# Patient Record
Sex: Female | Born: 1975
Health system: Southern US, Community
[De-identification: ages and names within clinical notes are randomized; demographics above are authoritative.]

## PROBLEM LIST (undated history)

## (undated) DIAGNOSIS — J302 Other seasonal allergic rhinitis: Secondary | ICD-10-CM

## (undated) DIAGNOSIS — R87619 Unspecified abnormal cytological findings in specimens from cervix uteri: Secondary | ICD-10-CM

## (undated) DIAGNOSIS — B019 Varicella without complication: Secondary | ICD-10-CM

## (undated) DIAGNOSIS — IMO0002 Reserved for concepts with insufficient information to code with codable children: Secondary | ICD-10-CM

## (undated) HISTORY — PX: ADENOIDECTOMY: SUR15

## (undated) HISTORY — PX: TONSILLECTOMY: SUR1361

## (undated) HISTORY — PX: APPENDECTOMY: SHX54

## (undated) HISTORY — DX: Varicella without complication: B01.9

## (undated) HISTORY — PX: WISDOM TOOTH EXTRACTION: SHX21

## (undated) HISTORY — DX: Reserved for concepts with insufficient information to code with codable children: IMO0002

## (undated) HISTORY — PX: LEEP: SHX91

## (undated) HISTORY — DX: Other seasonal allergic rhinitis: J30.2

## (undated) HISTORY — DX: Unspecified abnormal cytological findings in specimens from cervix uteri: R87.619

---

## 1998-01-14 ENCOUNTER — Ambulatory Visit (HOSPITAL_BASED_OUTPATIENT_CLINIC_OR_DEPARTMENT_OTHER): Admission: RE | Admit: 1998-01-14 | Discharge: 1998-01-14 | Payer: Self-pay | Admitting: Otolaryngology

## 1998-07-24 ENCOUNTER — Other Ambulatory Visit: Admission: RE | Admit: 1998-07-24 | Discharge: 1998-07-24 | Payer: Self-pay | Admitting: Obstetrics & Gynecology

## 1998-12-30 ENCOUNTER — Other Ambulatory Visit: Admission: RE | Admit: 1998-12-30 | Discharge: 1998-12-30 | Payer: Self-pay | Admitting: Obstetrics and Gynecology

## 1999-01-28 ENCOUNTER — Other Ambulatory Visit: Admission: RE | Admit: 1999-01-28 | Discharge: 1999-01-28 | Payer: Self-pay | Admitting: Obstetrics & Gynecology

## 1999-01-29 ENCOUNTER — Other Ambulatory Visit: Admission: RE | Admit: 1999-01-29 | Discharge: 1999-01-29 | Payer: Self-pay | Admitting: Obstetrics & Gynecology

## 1999-01-29 ENCOUNTER — Encounter (INDEPENDENT_AMBULATORY_CARE_PROVIDER_SITE_OTHER): Payer: Self-pay

## 1999-02-21 ENCOUNTER — Encounter (INDEPENDENT_AMBULATORY_CARE_PROVIDER_SITE_OTHER): Payer: Self-pay | Admitting: Specialist

## 1999-02-21 ENCOUNTER — Ambulatory Visit (HOSPITAL_COMMUNITY): Admission: RE | Admit: 1999-02-21 | Discharge: 1999-02-21 | Payer: Self-pay | Admitting: Obstetrics & Gynecology

## 1999-07-21 ENCOUNTER — Other Ambulatory Visit: Admission: RE | Admit: 1999-07-21 | Discharge: 1999-07-21 | Payer: Self-pay | Admitting: *Deleted

## 1999-11-26 ENCOUNTER — Other Ambulatory Visit: Admission: RE | Admit: 1999-11-26 | Discharge: 1999-11-26 | Payer: Self-pay | Admitting: Obstetrics and Gynecology

## 2000-05-31 ENCOUNTER — Other Ambulatory Visit: Admission: RE | Admit: 2000-05-31 | Discharge: 2000-05-31 | Payer: Self-pay | Admitting: Obstetrics and Gynecology

## 2000-11-26 ENCOUNTER — Other Ambulatory Visit: Admission: RE | Admit: 2000-11-26 | Discharge: 2000-11-26 | Payer: Self-pay | Admitting: Obstetrics and Gynecology

## 2001-06-22 ENCOUNTER — Other Ambulatory Visit: Admission: RE | Admit: 2001-06-22 | Discharge: 2001-06-22 | Payer: Self-pay | Admitting: Obstetrics and Gynecology

## 2001-08-17 ENCOUNTER — Other Ambulatory Visit: Admission: RE | Admit: 2001-08-17 | Discharge: 2001-08-17 | Payer: Self-pay | Admitting: Obstetrics and Gynecology

## 2001-09-06 ENCOUNTER — Emergency Department (HOSPITAL_COMMUNITY): Admission: EM | Admit: 2001-09-06 | Discharge: 2001-09-06 | Payer: Self-pay

## 2001-09-06 ENCOUNTER — Encounter: Payer: Self-pay | Admitting: Emergency Medicine

## 2001-11-23 ENCOUNTER — Other Ambulatory Visit: Admission: RE | Admit: 2001-11-23 | Discharge: 2001-11-23 | Payer: Self-pay | Admitting: Obstetrics and Gynecology

## 2003-04-04 ENCOUNTER — Other Ambulatory Visit: Admission: RE | Admit: 2003-04-04 | Discharge: 2003-04-04 | Payer: Self-pay | Admitting: Obstetrics and Gynecology

## 2003-11-14 ENCOUNTER — Other Ambulatory Visit: Admission: RE | Admit: 2003-11-14 | Discharge: 2003-11-14 | Payer: Self-pay | Admitting: Obstetrics and Gynecology

## 2005-01-07 ENCOUNTER — Other Ambulatory Visit: Admission: RE | Admit: 2005-01-07 | Discharge: 2005-01-07 | Payer: Self-pay | Admitting: Obstetrics and Gynecology

## 2005-12-06 ENCOUNTER — Emergency Department (HOSPITAL_COMMUNITY): Admission: EM | Admit: 2005-12-06 | Discharge: 2005-12-06 | Payer: Self-pay | Admitting: Family Medicine

## 2006-03-23 ENCOUNTER — Other Ambulatory Visit: Admission: RE | Admit: 2006-03-23 | Discharge: 2006-03-23 | Payer: Self-pay | Admitting: Obstetrics and Gynecology

## 2006-04-02 ENCOUNTER — Encounter: Admission: RE | Admit: 2006-04-02 | Discharge: 2006-04-02 | Payer: Self-pay | Admitting: Obstetrics and Gynecology

## 2010-04-18 ENCOUNTER — Observation Stay (HOSPITAL_COMMUNITY)
Admission: EM | Admit: 2010-04-18 | Discharge: 2010-04-19 | Payer: Self-pay | Source: Home / Self Care | Admitting: Emergency Medicine

## 2010-07-29 LAB — URINALYSIS, ROUTINE W REFLEX MICROSCOPIC
Leukocytes, UA: NEGATIVE
Nitrite: NEGATIVE
Protein, ur: NEGATIVE mg/dL
Specific Gravity, Urine: 1.021 (ref 1.005–1.030)
Urobilinogen, UA: 0.2 mg/dL (ref 0.0–1.0)

## 2010-07-29 LAB — DIFFERENTIAL
Basophils Absolute: 0 10*3/uL (ref 0.0–0.1)
Basophils Relative: 0 % (ref 0–1)
Eosinophils Absolute: 0.2 10*3/uL (ref 0.0–0.7)
Monocytes Absolute: 1 10*3/uL (ref 0.1–1.0)
Monocytes Relative: 7 % (ref 3–12)
Neutrophils Relative %: 69 % (ref 43–77)

## 2010-07-29 LAB — BASIC METABOLIC PANEL
BUN: 5 mg/dL — ABNORMAL LOW (ref 6–23)
CO2: 26 mEq/L (ref 19–32)
Calcium: 9.3 mg/dL (ref 8.4–10.5)
Glucose, Bld: 103 mg/dL — ABNORMAL HIGH (ref 70–99)
Sodium: 139 mEq/L (ref 135–145)

## 2010-07-29 LAB — URINE MICROSCOPIC-ADD ON

## 2010-07-29 LAB — CBC
Hemoglobin: 14.4 g/dL (ref 12.0–15.0)
MCH: 31.6 pg (ref 26.0–34.0)
MCHC: 33.3 g/dL (ref 30.0–36.0)
RDW: 12.6 % (ref 11.5–15.5)

## 2010-07-29 LAB — HEPATIC FUNCTION PANEL
Alkaline Phosphatase: 73 U/L (ref 39–117)
Total Bilirubin: 0.4 mg/dL (ref 0.3–1.2)
Total Protein: 6.7 g/dL (ref 6.0–8.3)

## 2010-07-29 LAB — LIPASE, BLOOD: Lipase: 24 U/L (ref 11–59)

## 2011-07-29 ENCOUNTER — Ambulatory Visit: Payer: Self-pay | Admitting: Obstetrics and Gynecology

## 2011-07-29 ENCOUNTER — Ambulatory Visit (INDEPENDENT_AMBULATORY_CARE_PROVIDER_SITE_OTHER): Payer: BC Managed Care – PPO | Admitting: Obstetrics and Gynecology

## 2011-07-29 DIAGNOSIS — Z8349 Family history of other endocrine, nutritional and metabolic diseases: Secondary | ICD-10-CM

## 2011-07-29 DIAGNOSIS — Z01419 Encounter for gynecological examination (general) (routine) without abnormal findings: Secondary | ICD-10-CM

## 2012-02-02 ENCOUNTER — Encounter: Payer: BC Managed Care – PPO | Admitting: Obstetrics and Gynecology

## 2012-02-16 ENCOUNTER — Encounter: Payer: Self-pay | Admitting: Obstetrics and Gynecology

## 2012-02-16 ENCOUNTER — Ambulatory Visit (INDEPENDENT_AMBULATORY_CARE_PROVIDER_SITE_OTHER): Payer: BC Managed Care – PPO | Admitting: Obstetrics and Gynecology

## 2012-02-16 VITALS — BP 116/76 | Resp 16 | Ht 65.0 in | Wt 209.0 lb

## 2012-02-16 DIAGNOSIS — IMO0001 Reserved for inherently not codable concepts without codable children: Secondary | ICD-10-CM | POA: Insufficient documentation

## 2012-02-16 DIAGNOSIS — R87610 Atypical squamous cells of undetermined significance on cytologic smear of cervix (ASC-US): Secondary | ICD-10-CM

## 2012-02-16 DIAGNOSIS — O3440 Maternal care for other abnormalities of cervix, unspecified trimester: Secondary | ICD-10-CM

## 2012-02-16 DIAGNOSIS — Z9889 Other specified postprocedural states: Secondary | ICD-10-CM | POA: Insufficient documentation

## 2012-02-16 NOTE — Progress Notes (Signed)
Last Pap Normal: no Date: 07/29/11 Grade: ASCUS High Risk HPV: no Vaginal Discharge:no Prior LEEP:yes 02/21/99 Prior Conization:no Prior Cryotherapy:no Prior Lazer:no

## 2012-02-16 NOTE — Progress Notes (Signed)
REPEAT PAP VISIT  Subjective:    Wendy Tate is a 36 y.o. female who presents for a  repeat pap.  Had long cycle in August (13 days), but normal cycles before and after.  No contraception--female partner.  History of previous cervical treatment:  LEEP 2000  Last pap showed:  ASCUS with NEGATIVE high risk HPV  Date 07/2011  High Risk HPV present: no   Tobacco use:  Yes   Objective:   Pelvic: WNL  External genitalia: normal Perianal skin: no external genital warts noted Vagina: normal without discharge Cervix: normal in appearance  Assessment and Plan:   ASCUS with neg HPV 3/13 Hx LEEP 2000 Pap repeated today. F/u at annual if no advancement in pap. F/U with me if cycles demonstrate recurrent changes.  Landi Biscardi, CNM 10/1/201310:21 AM

## 2012-02-19 LAB — HUMAN PAPILLOMAVIRUS, HIGH RISK: HPV DNA High Risk: NOT DETECTED

## 2012-02-22 ENCOUNTER — Encounter: Payer: Self-pay | Admitting: Obstetrics and Gynecology

## 2012-12-01 ENCOUNTER — Other Ambulatory Visit: Payer: Self-pay | Admitting: Obstetrics and Gynecology

## 2012-12-27 ENCOUNTER — Other Ambulatory Visit: Payer: Self-pay | Admitting: Obstetrics and Gynecology

## 2013-01-03 ENCOUNTER — Encounter (HOSPITAL_COMMUNITY): Payer: Self-pay | Admitting: Obstetrics and Gynecology

## 2013-01-12 ENCOUNTER — Encounter (HOSPITAL_COMMUNITY): Payer: Self-pay | Admitting: Pharmacy Technician

## 2013-01-17 NOTE — H&P (Signed)
Wendy Tate is an 37 y.o. female. Presenting for D&C Hysteroscopy polypectomy.  Pt states she has spotting 4-5 days before each menses.  She had SHG sig for endometrial polyp.  EMBX negative  Pertinent Gynecological History: Menses: flow is moderate Bleeding: intermenstrual bleeding Contraception: none DES exposure: denies Blood transfusions: none Sexually transmitted diseases: no past history Previous GYN Procedures: LEEP  Last mammogram: na Date: na Last pap: normal Date: 2014 OB History: G0, P0   Menstrual History: Menarche age: 48  Patient's last menstrual period was 12/11/2012.    Past Medical History  Diagnosis Date  . Abnormal Pap smear 04/2007, 04/2009, 07/2011    ASCUS     Past Surgical History  Procedure Laterality Date  . Tonsillectomy    . Appendectomy    . Leep    . Adenoidectomy      No family history on file.  Social History:  reports that she quit smoking about 17 months ago. She has never used smokeless tobacco. She reports that she drinks about 0.5 ounces of alcohol per week. She reports that she uses illicit drugs.  Allergies: No Known Allergies  No prescriptions prior to admission    ROS  Height 5\' 5"  (1.651 m), weight 206 lb (93.441 kg), last menstrual period 12/11/2012. Physical Exam Physical Examination: General appearance - alert, well appearing, and in no distress Mental status - alert, oriented to person, place, and time Chest - clear to auscultation, no wheezes, rales or rhonchi, symmetric air entry Heart - normal rate and regular rhythm Abdomen - soft, nontender, nondistended, no masses or organomegaly Pelvic - normal external genitalia, vulva, vagina, cervix, uterus and adnexa Extremities - peripheral pulses normal, no pedal edema, no clubbing or cyanosis   No results found for this or any previous visit (from the past 24 hour(s)).  No results found.  Assessment/Plan Metrorrhagia Endometrial polyp All treatments reviewed with  the pt .  These are but not limited to: obs with repeat SHG, hormones, D&C hysteroscopy with and without ablation and hysterectomy.   Pt chose D&C hysteroscopy with polypectomy.  She understands the risks are but not limited to bleeding, infection, perforation of the uterus which can cause damage to internal organs.    Milbern Doescher A 01/17/2013, 10:04 PM

## 2013-01-18 ENCOUNTER — Encounter (HOSPITAL_COMMUNITY)
Admission: RE | Disposition: A | Discharge status: Home / Self Care | Payer: Self-pay | Source: Ambulatory Visit | Attending: Obstetrics and Gynecology

## 2013-01-18 ENCOUNTER — Ambulatory Visit (HOSPITAL_COMMUNITY): Payer: BC Managed Care – PPO | Admitting: Anesthesiology

## 2013-01-18 ENCOUNTER — Encounter (HOSPITAL_COMMUNITY): Payer: Self-pay | Admitting: Certified Registered"

## 2013-01-18 ENCOUNTER — Ambulatory Visit (HOSPITAL_COMMUNITY)
Admission: RE | Admit: 2013-01-18 | Discharge: 2013-01-18 | Disposition: A | Discharge status: Home / Self Care | Payer: BC Managed Care – PPO | Source: Ambulatory Visit | Attending: Obstetrics and Gynecology | Admitting: Obstetrics and Gynecology

## 2013-01-18 ENCOUNTER — Encounter (HOSPITAL_COMMUNITY): Payer: Self-pay | Admitting: Anesthesiology

## 2013-01-18 DIAGNOSIS — N921 Excessive and frequent menstruation with irregular cycle: Secondary | ICD-10-CM | POA: Insufficient documentation

## 2013-01-18 DIAGNOSIS — N840 Polyp of corpus uteri: Secondary | ICD-10-CM | POA: Insufficient documentation

## 2013-01-18 HISTORY — PX: DILATATION & CURETTAGE/HYSTEROSCOPY WITH TRUECLEAR: SHX6353

## 2013-01-18 LAB — CBC
HCT: 40.4 % (ref 36.0–46.0)
Hemoglobin: 13.9 g/dL (ref 12.0–15.0)
WBC: 8.3 10*3/uL (ref 4.0–10.5)

## 2013-01-18 SURGERY — DILATATION & CURETTAGE/HYSTEROSCOPY WITH TRUCLEAR
Anesthesia: General | Site: Vagina | Wound class: Clean Contaminated

## 2013-01-18 MED ORDER — SODIUM CHLORIDE 0.9 % IR SOLN
Status: DC | PRN
  Administered 2013-01-18 (×2): 3000 mL

## 2013-01-18 MED ORDER — FENTANYL CITRATE 0.05 MG/ML IJ SOLN
INTRAMUSCULAR | Status: AC
  Filled 2013-01-18: qty 2

## 2013-01-18 MED ORDER — ONDANSETRON HCL 4 MG/2ML IJ SOLN
INTRAMUSCULAR | Status: AC
  Filled 2013-01-18: qty 2

## 2013-01-18 MED ORDER — SILVER NITRATE-POT NITRATE 75-25 % EX MISC
CUTANEOUS | Status: AC
  Filled 2013-01-18: qty 2

## 2013-01-18 MED ORDER — MIDAZOLAM HCL 2 MG/2ML IJ SOLN
INTRAMUSCULAR | Status: AC
  Filled 2013-01-18: qty 2

## 2013-01-18 MED ORDER — MIDAZOLAM HCL 2 MG/2ML IJ SOLN
INTRAMUSCULAR | Status: DC | PRN
  Administered 2013-01-18: 2 mg via INTRAVENOUS

## 2013-01-18 MED ORDER — LIDOCAINE HCL (CARDIAC) 20 MG/ML IV SOLN
INTRAVENOUS | Status: DC | PRN
  Administered 2013-01-18: 80 mg via INTRAVENOUS

## 2013-01-18 MED ORDER — KETOROLAC TROMETHAMINE 30 MG/ML IJ SOLN
INTRAMUSCULAR | Status: DC | PRN
  Administered 2013-01-18: 30 mg via INTRAVENOUS

## 2013-01-18 MED ORDER — LACTATED RINGERS IV SOLN
INTRAVENOUS | Status: DC
  Administered 2013-01-18 (×2): via INTRAVENOUS

## 2013-01-18 MED ORDER — DEXAMETHASONE SODIUM PHOSPHATE 10 MG/ML IJ SOLN
INTRAMUSCULAR | Status: DC | PRN
  Administered 2013-01-18: 10 mg via INTRAVENOUS

## 2013-01-18 MED ORDER — LIDOCAINE HCL 2 % IJ SOLN
INTRAMUSCULAR | Status: DC | PRN
  Administered 2013-01-18: 20 mL

## 2013-01-18 MED ORDER — HYDROCODONE-ACETAMINOPHEN 5-325 MG PO TABS: 1.0000 | ORAL_TABLET | Freq: Four times a day (QID) | ORAL | Status: DC | PRN

## 2013-01-18 MED ORDER — FENTANYL CITRATE 0.05 MG/ML IJ SOLN: 25.0000 ug | INTRAMUSCULAR | Status: DC | PRN

## 2013-01-18 MED ORDER — PROPOFOL 10 MG/ML IV BOLUS
INTRAVENOUS | Status: DC | PRN
  Administered 2013-01-18: 160 mg via INTRAVENOUS

## 2013-01-18 MED ORDER — LIDOCAINE HCL 2 % IJ SOLN
INTRAMUSCULAR | Status: AC
  Filled 2013-01-18: qty 20

## 2013-01-18 MED ORDER — ONDANSETRON HCL 4 MG/2ML IJ SOLN
INTRAMUSCULAR | Status: DC | PRN
  Administered 2013-01-18: 4 mg via INTRAVENOUS

## 2013-01-18 MED ORDER — FENTANYL CITRATE 0.05 MG/ML IJ SOLN
INTRAMUSCULAR | Status: DC | PRN
  Administered 2013-01-18 (×2): 50 ug via INTRAVENOUS

## 2013-01-18 SURGICAL SUPPLY — 23 items
BLADE INCISOR TRUC PLUS 2.9 (ABLATOR) IMPLANT
CANISTERS HI-FLOW 3000CC (CANNISTER) ×2 IMPLANT
CATH ROBINSON RED A/P 16FR (CATHETERS) ×2 IMPLANT
CLOTH BEACON ORANGE TIMEOUT ST (SAFETY) ×2 IMPLANT
CONTAINER PREFILL 10% NBF 60ML (FORM) ×4 IMPLANT
DRAPE HYSTEROSCOPY (DRAPE) ×2 IMPLANT
DRESSING TELFA 8X3 (GAUZE/BANDAGES/DRESSINGS) ×2 IMPLANT
ELECT REM PT RETURN 9FT ADLT (ELECTROSURGICAL) ×2
ELECTRODE REM PT RTRN 9FT ADLT (ELECTROSURGICAL) ×1 IMPLANT
GLOVE BIO SURGEON STRL SZ 6.5 (GLOVE) ×2 IMPLANT
GLOVE BIOGEL PI IND STRL 7.0 (GLOVE) ×1 IMPLANT
GLOVE BIOGEL PI INDICATOR 7.0 (GLOVE) ×1
GOWN STRL REIN XL XLG (GOWN DISPOSABLE) ×4 IMPLANT
INCISOR TRUC PLUS BLADE 2.9 (ABLATOR)
KIT HYSTEROSCOPY TRUCLEAR (ABLATOR) ×1 IMPLANT
MORCELLATOR RECIP TRUCLEAR 4.0 (ABLATOR) IMPLANT
NDL SPNL 22GX3.5 QUINCKE BK (NEEDLE) ×1 IMPLANT
NEEDLE SPNL 22GX3.5 QUINCKE BK (NEEDLE) ×2 IMPLANT
PACK VAGINAL MINOR WOMEN LF (CUSTOM PROCEDURE TRAY) ×2 IMPLANT
PAD OB MATERNITY 4.3X12.25 (PERSONAL CARE ITEMS) ×2 IMPLANT
SYR 20CC LL (SYRINGE) ×2 IMPLANT
TOWEL OR 17X24 6PK STRL BLUE (TOWEL DISPOSABLE) ×4 IMPLANT
WATER STERILE IRR 1000ML POUR (IV SOLUTION) ×2 IMPLANT

## 2013-01-18 NOTE — Op Note (Signed)
Pre op DX: Endometrial Polyp, CPT 58558 1 hour   Post Op [id number] with metrorrhagia   PHYSICIAN : Elowen Debruyn   ASSISTANTS: none   ANESTHESIA:   General LMA and paracervical block  ESTIMATED BLOOD LOSS: minimal  LOCAL MEDICATIONS USED:  LIDOCAINE 20CC  SPECIMEN:  Source of Specimen:  endometrial curettings and polyp  DISPOSITION OF SPECIMEN:  PATHOLOGY  COUNTS Correct:  YES    DICTATION #: The patient was taken to the operating room and prepped and draped in a normal sterile fashion. An in out catheter was used to drain the bladder.   A bivalve speculum was placed into the vagina and anterior lip of the cervix was grasped with a single-tooth tenaculum.  20 cc of 2% lidocaine was used for cervical block.  the cervix was then dilated with Shawnie Pons dilators up to 19. The hysteroscope was placed into the uterine cavity. The  entire uterus and both ostia were visualized.  A fundal polyp was removed using trueclear. Some fluffy endometrium was also removed using trueclear. The endometrial curettings and polyp were sent to pathology. The hysteroscope was removed.   The tenaculum was removed from the cervix and hemostasis was noted.  Silver nitrate was applied to the tenaculum site PLAN OF CARE: discharge to home  PATIENT DISPOSITION:  PACU - hemodynamically stable.

## 2013-01-18 NOTE — Interval H&P Note (Signed)
History and Physical Interval Note:  01/18/2013 10:13 AM  Wendy Tate  has presented today for surgery, with the diagnosis of Endometrial Polyp,   The various methods of treatment have been discussed with the patient and family. After consideration of risks, benefits and other options for treatment, the patient has consented to  Procedure(s) with comments: DILATATION & CURETTAGE/HYSTEROSCOPY WITH TRUECLEAR (N/A) - D&C Hysteroscopy with TruClear as a surgical intervention .  The patient's history has been reviewed, patient examined, no change in status, stable for surgery.  I have reviewed the patient's chart and labs.  Questions were answered to the patient's satisfaction.     [facility] A

## 2013-01-18 NOTE — Transfer of Care (Signed)
Immediate Anesthesia Transfer of Care Note  Patient: Wendy Tate  Procedure(s) Performed: Procedure(s) with comments: DILATATION & CURETTAGE/HYSTEROSCOPY WITH TRUECLEAR (N/A) - D&C Hysteroscopy with TruClear  Patient Location: PACU  Anesthesia Type:General  Level of Consciousness: awake, alert  and oriented  Airway & Oxygen Therapy: Patient Spontanous Breathing and Patient connected to nasal cannula oxygen  Post-op Assessment: Report given to PACU RN, Post -op Vital signs reviewed and stable and Patient moving all extremities  Post vital signs: Reviewed and stable  Complications: No apparent anesthesia complications

## 2013-01-18 NOTE — Anesthesia Postprocedure Evaluation (Signed)
  Anesthesia Post-op Note  Patient: Wendy Tate  Procedure(s) Performed: Procedure(s) with comments: DILATATION & CURETTAGE/HYSTEROSCOPY WITH TRUECLEAR (N/A) - D&C Hysteroscopy with TruClear Patient is awake and responsive. Pain and nausea are reasonably well controlled. Vital signs are stable and clinically acceptable. Oxygen saturation is clinically acceptable. There are no apparent anesthetic complications at this time. Patient is ready for discharge.

## 2013-01-18 NOTE — Preoperative (Signed)
Beta Blockers   Reason not to administer Beta Blockers:Not Applicable 

## 2013-01-18 NOTE — Anesthesia Preprocedure Evaluation (Signed)
Anesthesia Evaluation  Patient identified by MRN, date of birth, ID band Patient awake    Reviewed: Allergy & Precautions, H&P , Patient's Chart, lab work & pertinent test results, reviewed documented beta blocker date and time   Airway Mallampati: II TM Distance: >3 FB Neck ROM: full    Dental no notable dental hx.    Pulmonary  breath sounds clear to auscultation  Pulmonary exam normal       Cardiovascular Rhythm:regular Rate:Normal     Neuro/Psych    GI/Hepatic   Endo/Other    Renal/GU      Musculoskeletal   Abdominal   Peds  Hematology   Anesthesia Other Findings   Reproductive/Obstetrics                           Anesthesia Physical Anesthesia Plan  ASA: II  Anesthesia Plan:    Post-op Pain Management:    Induction: Intravenous  Airway Management Planned: LMA  Additional Equipment:   Intra-op Plan:   Post-operative Plan:   Informed Consent: I have reviewed the patients History and Physical, chart, labs and discussed the procedure including the risks, benefits and alternatives for the proposed anesthesia with the patient or authorized representative who has indicated his/her understanding and acceptance.   Dental Advisory Given and Dental advisory given  Plan Discussed with: CRNA and Surgeon  Anesthesia Plan Comments:         Anesthesia Quick Evaluation  

## 2013-01-19 ENCOUNTER — Encounter (HOSPITAL_COMMUNITY): Payer: Self-pay | Admitting: Obstetrics and Gynecology

## 2014-07-03 ENCOUNTER — Other Ambulatory Visit: Payer: Self-pay | Admitting: Obstetrics and Gynecology

## 2014-07-03 DIAGNOSIS — Z1231 Encounter for screening mammogram for malignant neoplasm of breast: Secondary | ICD-10-CM

## 2014-08-23 ENCOUNTER — Other Ambulatory Visit: Payer: Self-pay | Admitting: Obstetrics and Gynecology

## 2014-08-23 ENCOUNTER — Ambulatory Visit
Admission: RE | Admit: 2014-08-23 | Discharge: 2014-08-23 | Disposition: A | Discharge status: Home / Self Care | Payer: No Typology Code available for payment source | Source: Ambulatory Visit | Attending: Obstetrics and Gynecology | Admitting: Obstetrics and Gynecology

## 2014-08-23 DIAGNOSIS — Z1231 Encounter for screening mammogram for malignant neoplasm of breast: Secondary | ICD-10-CM

## 2016-02-29 LAB — BASIC METABOLIC PANEL
BUN: 7 mg/dL (ref 4–21)
Creatinine: 0.7 mg/dL (ref 0.5–1.1)
GLUCOSE: 81 mg/dL
Potassium: 4.4 mmol/L (ref 3.4–5.3)
SODIUM: 140 mmol/L (ref 137–147)

## 2016-02-29 LAB — HEPATIC FUNCTION PANEL
ALT: 15 U/L (ref 7–35)
AST: 17 U/L (ref 13–35)
Alkaline Phosphatase: 69 U/L (ref 25–125)
Bilirubin, Total: 0.3 mg/dL

## 2016-02-29 LAB — CBC AND DIFFERENTIAL
HEMATOCRIT: 43 % (ref 36–46)
Hemoglobin: 14.4 g/dL (ref 12.0–16.0)
Platelets: 276 10*3/uL (ref 150–399)
WBC: 7.8 10^3/mL

## 2016-03-18 ENCOUNTER — Encounter: Payer: Self-pay | Admitting: Family Medicine

## 2016-03-18 ENCOUNTER — Ambulatory Visit (INDEPENDENT_AMBULATORY_CARE_PROVIDER_SITE_OTHER): Payer: Managed Care, Other (non HMO) | Admitting: Family Medicine

## 2016-03-18 ENCOUNTER — Other Ambulatory Visit: Payer: Self-pay | Admitting: Emergency Medicine

## 2016-03-18 VITALS — BP 112/72 | HR 81 | Temp 99.0°F | Ht 64.5 in | Wt 207.4 lb

## 2016-03-18 DIAGNOSIS — Z23 Encounter for immunization: Secondary | ICD-10-CM

## 2016-03-18 DIAGNOSIS — E669 Obesity, unspecified: Secondary | ICD-10-CM | POA: Diagnosis not present

## 2016-03-18 DIAGNOSIS — Z1329 Encounter for screening for other suspected endocrine disorder: Secondary | ICD-10-CM

## 2016-03-18 DIAGNOSIS — Z13220 Encounter for screening for lipoid disorders: Secondary | ICD-10-CM

## 2016-03-18 LAB — LIPID PANEL
CHOLESTEROL: 219 mg/dL — AB (ref 0–200)
HDL: 41.1 mg/dL (ref >39.00)
LDL Cholesterol: 153 mg/dL — ABNORMAL HIGH (ref 0–99)
NonHDL: 178.08
Total CHOL/HDL Ratio: 5
Triglycerides: 125 mg/dL (ref 0.0–149.0)
VLDL: 25 mg/dL (ref 0.0–40.0)

## 2016-03-18 LAB — TSH: TSH: 1.92 u[IU]/mL (ref 0.35–4.50)

## 2016-03-18 NOTE — Progress Notes (Signed)
[place] at [facility] [address] [place], [place] [address] [phone]  Date:  03/18/2016   Name:  Wendy Tate   DOB:  11/30/1975   MRN:  [id number]  PCP:  Lamar Blinks, MD    Chief Complaint: No chief complaint on file.   History of Present Illness:  Wendy Tate is a 40 y.o. very pleasant female patient who presents with the following:  Here today to establish as a new patient.  Her usual health care provider is Donnel Saxon who is a Proofreader at [employer].   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   She is married, no children.  She enjoys doing crafts in her free time, and she also enjoys being out in nature.   Admits that she does not get a lot of exercise but she has started an at home walking DVD this week.  She has also considered joining a gym She is fasting today for labs and recently had a CMP/ CBC done per [facility]- this is a weight loss program that is popular in our area.  She does need a TSH and lipids still  She does not know the date of her last tetanus; would like to do today She has done a mammogram in May- it was normal  She may spoke an occasional clove cigar.  Maybe 2 a month Patient Active Problem List   Diagnosis Date Noted  . ASCUS (atypical squamous cells of undetermined significance) on Pap smear 02/16/2012  . Hx LEEP (loop electrosurgical excision procedure 02/16/2012    Past Medical History:  Diagnosis Date  . Abnormal Pap smear 04/2007, 04/2009, 07/2011   ASCUS   . Chicken pox   . Seasonal allergies     Past Surgical History:  Procedure Laterality Date  . ADENOIDECTOMY    . APPENDECTOMY    . DILATATION & CURETTAGE/HYSTEROSCOPY WITH TRUECLEAR N/A 01/18/2013   Procedure: DILATATION & CURETTAGE/HYSTEROSCOPY WITH  TRUECLEAR;  Surgeon: Betsy Coder, MD;  Location: [facility];  Service: Gynecology;  Laterality: N/A;  D&C Hysteroscopy with TruClear  . LEEP    . TONSILLECTOMY    . WISDOM TOOTH EXTRACTION      Social History  Substance Use Topics  . Smoking status: Light Tobacco Smoker    Last attempt to quit: 08/04/2011  . Smokeless tobacco: Never Used     Comment: Socially  . Alcohol use 0.5 oz/week    1 Standard drinks or equivalent per week     Comment: socially    Family History  Problem Relation Age of Onset  . Heart disease Mother   . Diabetes Mother   . Heart disease Father   . Leukemia Father     died at age 72  . Arthritis Maternal Grandmother   . Stroke Maternal Grandmother   . Diabetes Maternal Grandmother     No Known Allergies  Medication list has been reviewed and updated.  Current Outpatient Prescriptions on File Prior to Visit  Medication Sig Dispense Refill  . HYDROcodone-acetaminophen (NORCO/VICODIN) 5-325 MG per tablet Take 1 tablet by mouth every 6 (six) hours as needed for pain. 30 tablet 0  . ibuprofen (ADVIL,MOTRIN) 200 MG tablet Take 600 mg by mouth every 6 (six) hours as needed for  pain.     No current facility-administered medications on file prior to visit.     Review of Systems:  As per HPI- otherwise negative.   Physical Examination: Vitals:   03/18/16 1327  BP: 112/72  Pulse: 81  Temp: 99 F (37.2 C)   Vitals:   03/18/16 1327  Weight: 207 lb 6.4 oz (94.1 kg)  Height: 5' 4.5" (1.638 m)   Body mass index is 35.05 kg/m. Ideal Body Weight: Weight in (lb) to have BMI = 25: 147.6  GEN: WDWN, NAD, Non-toxic, A & O x 3, obese, otherwise looks well HEENT: Atraumatic, Normocephalic. Neck supple. No masses, No LAD.  Bilateral TM wnl, oropharynx normal.  PEERL,EOMI.   Ears and Nose: No external deformity. CV: RRR, No M/G/R. No JVD. No thrill. No extra heart sounds. PULM: CTA B, no wheezes, crackles, rhonchi. No retractions. No resp. distress. No  accessory muscle use. ABD: S, NT, ND EXTR: No c/c/e NEURO Normal gait.  PSYCH: Normally interactive. Conversant. Not depressed or anxious appearing.  Calm demeanor.    Assessment and Plan: Screening for hyperlipidemia - Plan: Lipid panel  Immunization due - Plan: Tdap vaccine greater than or equal to 7yo IM  Screening for thyroid disorder - Plan: TSH  Obesity, unspecified classification, unspecified obesity type, unspecified whether serious comorbidity present  Here today to establish care Her main health concern is obesity and she is actively planning to start a weight loss program Will check her TSH and lipids today tdap today- declines flu shot See patient instructions for more details.     Signed Lamar Blinks, MD

## 2016-03-18 NOTE — Patient Instructions (Signed)
It was very nice to meet you today-  I will be in touch with your additional labs asap Good luck with your diet and exercise program- let me know if you have any concerns or questions during this process. Let's plan to check back in about 6 months (assuming your labs are normal) to evaluate your progress.

## 2016-03-18 NOTE — Progress Notes (Signed)
Pre visit review using our clinic review tool, if applicable. No additional management support is needed unless otherwise documented below in the visit note. 

## 2016-04-07 ENCOUNTER — Encounter: Payer: Self-pay | Admitting: Family Medicine

## 2016-09-21 ENCOUNTER — Ambulatory Visit: Payer: Managed Care, Other (non HMO) | Admitting: Family Medicine

## 2016-10-05 ENCOUNTER — Ambulatory Visit (INDEPENDENT_AMBULATORY_CARE_PROVIDER_SITE_OTHER): Payer: Managed Care, Other (non HMO) | Admitting: Family Medicine

## 2016-10-05 ENCOUNTER — Encounter: Payer: Self-pay | Admitting: Family Medicine

## 2016-10-05 VITALS — BP 125/73 | HR 73 | Temp 98.4°F | Ht 64.5 in | Wt 211.8 lb

## 2016-10-05 DIAGNOSIS — R200 Anesthesia of skin: Secondary | ICD-10-CM | POA: Diagnosis not present

## 2016-10-05 DIAGNOSIS — Z131 Encounter for screening for diabetes mellitus: Secondary | ICD-10-CM

## 2016-10-05 DIAGNOSIS — Z130 Encounter for screening for diseases of the blood and blood-forming organs and certain disorders involving the immune mechanism: Secondary | ICD-10-CM | POA: Diagnosis not present

## 2016-10-05 DIAGNOSIS — Z1329 Encounter for screening for other suspected endocrine disorder: Secondary | ICD-10-CM

## 2016-10-05 DIAGNOSIS — E559 Vitamin D deficiency, unspecified: Secondary | ICD-10-CM

## 2016-10-05 DIAGNOSIS — Z1321 Encounter for screening for nutritional disorder: Secondary | ICD-10-CM

## 2016-10-05 NOTE — Patient Instructions (Signed)
It was very nice to see you today!  I will be in touch with your labs asap- we will have you come in at your convenience to do a fasting cholesterol panel sometime prior to august.   I am sorry that your mother was so sick but very glad that she is doing better! Try wearing an OTC "carpal tunnel splint" on one or both wrists at night for a couple of weeks and see if this makes any difference.  Assuming your labs are normal I will refer you to neurology

## 2016-10-05 NOTE — Progress Notes (Signed)
Patient in for follow up visit

## 2016-10-05 NOTE — Progress Notes (Addendum)
[place] at [facility] [address], [place], [place] [address] [phone]  Date:  10/05/2016   Name:  Wendy Tate   DOB:  Mar 17, 1976   MRN:  [id number]  PCP:  Darreld Mclean, MD    Chief Complaint: Follow-up   History of Present Illness:  Wendy Tate is a 41 y.o. very pleasant female patient who presents with the following:  Here today to follow-up on her cholesterol  Wt Readings from Last 3 Encounters:  10/05/16 211 lb 12.8 oz (96.1 kg)  03/18/16 207 lb 6.4 oz (94.1 kg)  01/03/13 206 lb (93.4 kg)   She did not really get on her diet as planned because her mom got quite sick at the end of last year- she had an MI and had to live with her for a while Her mother had the MI on Thanksgiving day- she had CABG, got an infection, wound vac.    She tends to have numbness in her bilateral arms and hands. Generally both sides are effected.  This almost always will only occur at night, but can occur during the day as well.  Can occur several times in the course of one night.  Sometimes will wake her up Sx are not in her feet.   No weakness or difficulty using her hands This has been going on for several years, but has become more bothersome/ frequent.   She has not noted any other neurological issues - no other numbness, weakness, no slurred speech, etc  Patient Active Problem List   Diagnosis Date Noted  . Obesity 03/18/2016  . ASCUS (atypical squamous cells of undetermined significance) on Pap smear 02/16/2012  . Hx LEEP (loop electrosurgical excision procedure 02/16/2012    Past Medical History:  Diagnosis Date  . Abnormal Pap smear 04/2007, 04/2009, 07/2011   ASCUS   . Chicken pox   . Seasonal allergies     Past Surgical History:  Procedure Laterality Date  . ADENOIDECTOMY    . APPENDECTOMY    . DILATATION & CURETTAGE/HYSTEROSCOPY WITH TRUECLEAR N/A 01/18/2013   Procedure: DILATATION & CURETTAGE/HYSTEROSCOPY WITH  TRUECLEAR;  Surgeon: Betsy Coder, MD;  Location: [facility];  Service: Gynecology;  Laterality: N/A;  D&C Hysteroscopy with TruClear  . LEEP    . TONSILLECTOMY    . WISDOM TOOTH EXTRACTION      Social History  Substance Use Topics  . Smoking status: Light Tobacco Smoker    Last attempt to quit: 08/04/2011  . Smokeless tobacco: Never Used     Comment: Socially  . Alcohol use 0.5 oz/week    1 Standard drinks or equivalent per week     Comment: socially    Family History  Problem Relation Age of Onset  . Heart disease Mother   . Diabetes Mother   . Heart disease Father   . Leukemia Father        died at age 64  . Arthritis Maternal Grandmother   . Stroke Maternal Grandmother   . Diabetes Maternal Grandmother     No Known Allergies  Medication list has been reviewed and updated.  No current outpatient prescriptions on file prior to visit.   No current facility-administered medications on file prior to visit.     Review of Systems:  As per HPI- otherwise negative.  No fever, chills, CP, SOB, rash, GI symptoms    Physical Examination: Vitals:   10/05/16 1443  BP: 125/73  Pulse: 73  Temp: 98.4 F (36.9 C)   Vitals:   10/05/16 1443  Weight: 211 lb 12.8 oz (96.1 kg)  Height: 5' 4.5" (1.638 m)   Body mass index is 35.79 kg/m. Ideal Body Weight: Weight in (lb) to have BMI = 25: 147.6  GEN: WDWN, NAD, Non-toxic, A & O x 3, looks well, overweight HEENT: Atraumatic, Normocephalic. Neck supple. No masses, No LAD.  Bilateral TM wnl, oropharynx normal.  PEERL,EOMI.   Ears and Nose: No external deformity. CV: RRR, No M/G/R. No JVD. No thrill. No extra heart sounds. PULM: CTA B, no wheezes, crackles, rhonchi. No retractions. No resp. distress. No accessory muscle use. ABD: S, NT, ND, +BS. No rebound. No HSM. EXTR: No c/c/e NEURO Normal gait.  PSYCH: Normally interactive. Conversant. Not depressed or anxious appearing.  Calm demeanor.  Normal strength, sensation and  DTR of both UE   Assessment and Plan: Numbness of fingers of both hands - Plan: B12, Folate, Vitamin D (25 hydroxy)  Encounter for vitamin deficiency screening - Plan: B12, Folate, Vitamin D (25 hydroxy)  Screening for thyroid disorder - Plan: TSH  Screening for deficiency anemia - Plan: CBC  Screening for diabetes mellitus - Plan: Hemoglobin A1c, Comprehensive metabolic panel  Here today to discuss a concern about numbness in both hands that will come and go several times each night.   Will obtain labs as above- assuming normal will plan to have her see neurology Will also try a CTS splint for her Will obtain other screening labs as above   Signed Lamar Blinks, MD  Results for orders placed or performed in visit on 10/05/16  CBC  Result Value Ref Range   WBC 6.9 4.0 - 10.5 K/uL   RBC 4.42 3.87 - 5.11 Mil/uL   Platelets 251.0 150.0 - 400.0 K/uL   Hemoglobin 13.8 12.0 - 15.0 g/dL   HCT 42.2 36.0 - 46.0 %   MCV 95.6 78.0 - 100.0 fl   MCHC 32.7 30.0 - 36.0 g/dL   RDW 13.0 11.5 - 15.5 %  Hemoglobin A1c  Result Value Ref Range   Hgb A1c MFr Bld 5.5 4.6 - 6.5 %  TSH  Result Value Ref Range   TSH 1.75 0.35 - 4.50 uIU/mL  Comprehensive metabolic panel  Result Value Ref Range   Sodium 140 135 - 145 mEq/L   Potassium 4.3 3.5 - 5.1 mEq/L   Chloride 105 96 - 112 mEq/L   CO2 28 19 - 32 mEq/L   Glucose, Bld 89 70 - 99 mg/dL   BUN 10 6 - 23 mg/dL   Creatinine, Ser 0.77 0.40 - 1.20 mg/dL   Total Bilirubin 0.3 0.2 - 1.2 mg/dL   Alkaline Phosphatase 62 39 - 117 U/L   AST 16 0 - 37 U/L   ALT 17 0 - 35 U/L   Total Protein 6.7 6.0 - 8.3 g/dL   Albumin 4.5 3.5 - 5.2 g/dL   Calcium 9.6 8.4 - 10.5 mg/dL   GFR 87.31 >60.00 mL/min  B12  Result Value Ref Range   Vitamin B-12 466 211 - 911 pg/mL  Folate  Result Value Ref Range   Folate 16.1 >5.9 ng/mL  Vitamin D (25 hydroxy)  Result Value Ref Range   VITD 18.09 (L) 30.00 - 100.00 ng/mL   Message to pt on mychart- will rx  vitamin D and go ahead with neurology referral

## 2016-10-06 ENCOUNTER — Encounter: Payer: Self-pay | Admitting: Family Medicine

## 2016-10-06 LAB — CBC
HCT: 42.2 % (ref 36.0–46.0)
Hemoglobin: 13.8 g/dL (ref 12.0–15.0)
MCHC: 32.7 g/dL (ref 30.0–36.0)
MCV: 95.6 fl (ref 78.0–100.0)
Platelets: 251 10*3/uL (ref 150.0–400.0)
RBC: 4.42 Mil/uL (ref 3.87–5.11)
RDW: 13 % (ref 11.5–15.5)
WBC: 6.9 10*3/uL (ref 4.0–10.5)

## 2016-10-06 LAB — COMPREHENSIVE METABOLIC PANEL
ALT: 17 U/L (ref 0–35)
AST: 16 U/L (ref 0–37)
Albumin: 4.5 g/dL (ref 3.5–5.2)
Alkaline Phosphatase: 62 U/L (ref 39–117)
BUN: 10 mg/dL (ref 6–23)
CO2: 28 meq/L (ref 19–32)
Calcium: 9.6 mg/dL (ref 8.4–10.5)
Chloride: 105 mEq/L (ref 96–112)
Creatinine, Ser: 0.77 mg/dL (ref 0.40–1.20)
GFR: 87.31 mL/min (ref >60.00)
Glucose, Bld: 89 mg/dL (ref 70–99)
POTASSIUM: 4.3 meq/L (ref 3.5–5.1)
SODIUM: 140 meq/L (ref 135–145)
Total Bilirubin: 0.3 mg/dL (ref 0.2–1.2)
Total Protein: 6.7 g/dL (ref 6.0–8.3)

## 2016-10-06 LAB — VITAMIN D 25 HYDROXY (VIT D DEFICIENCY, FRACTURES): VITD: 18.09 ng/mL — ABNORMAL LOW (ref 30.00–100.00)

## 2016-10-06 LAB — TSH: TSH: 1.75 u[IU]/mL (ref 0.35–4.50)

## 2016-10-06 LAB — VITAMIN B12: Vitamin B-12: 466 pg/mL (ref 211–911)

## 2016-10-06 LAB — HEMOGLOBIN A1C: HEMOGLOBIN A1C: 5.5 % (ref 4.6–6.5)

## 2016-10-06 LAB — FOLATE: Folate: 16.1 ng/mL (ref >5.9)

## 2016-10-06 MED ORDER — VITAMIN D (ERGOCALCIFEROL) 1.25 MG (50000 UNIT) PO CAPS: 50000.0000 [IU] | ORAL_CAPSULE | ORAL | 0 refills | Status: DC

## 2016-10-06 NOTE — Addendum Note (Signed)
Addended by: Lamar Blinks C on: 10/06/2016 05:57 PM   Modules accepted: Orders

## 2016-10-07 ENCOUNTER — Encounter: Payer: Self-pay | Admitting: Neurology

## 2016-11-13 ENCOUNTER — Other Ambulatory Visit (INDEPENDENT_AMBULATORY_CARE_PROVIDER_SITE_OTHER): Payer: Managed Care, Other (non HMO)

## 2016-11-13 DIAGNOSIS — E789 Disorder of lipoprotein metabolism, unspecified: Secondary | ICD-10-CM | POA: Diagnosis not present

## 2016-11-13 LAB — LIPID PANEL
CHOL/HDL RATIO: 5
Cholesterol: 196 mg/dL (ref 0–200)
HDL: 42.6 mg/dL (ref >39.00)
LDL CALC: 125 mg/dL — AB (ref 0–99)
NonHDL: 153.05
TRIGLYCERIDES: 140 mg/dL (ref 0.0–149.0)
VLDL: 28 mg/dL (ref 0.0–40.0)

## 2016-11-14 ENCOUNTER — Encounter: Payer: Self-pay | Admitting: Family Medicine

## 2016-12-30 ENCOUNTER — Ambulatory Visit (INDEPENDENT_AMBULATORY_CARE_PROVIDER_SITE_OTHER): Payer: Managed Care, Other (non HMO) | Admitting: Neurology

## 2016-12-30 ENCOUNTER — Encounter: Payer: Self-pay | Admitting: Neurology

## 2016-12-30 VITALS — BP 100/70 | HR 81 | Ht 64.5 in | Wt 215.3 lb

## 2016-12-30 DIAGNOSIS — R200 Anesthesia of skin: Secondary | ICD-10-CM | POA: Diagnosis not present

## 2016-12-30 DIAGNOSIS — G5603 Carpal tunnel syndrome, bilateral upper limbs: Secondary | ICD-10-CM | POA: Diagnosis not present

## 2016-12-30 DIAGNOSIS — R202 Paresthesia of skin: Secondary | ICD-10-CM

## 2016-12-30 NOTE — Progress Notes (Signed)
[facility] Clinic Note - Initial Visit   Date: 12/30/16  Wendy Tate MRN: [id number] DOB: [birth year]   Dear Dr. Lorelei Pont:  Thank you for your kind referral of Wendy Tate for consultation of Tate. Although her history is well known to you, please allow [place] to reiterate it for the purpose of our medical record. The patient was accompanied to the clinic by self.   History of Present Illness: Wendy Tate is a 41 y.o. right-handed Caucasian female with no prior medical history presenting for evaluation of bilateral hand Tate.    Starting around 2013 she began experiencing numbness/tingling of both hands over the palms, occurring a few times per month.  Over the past year, she had symptoms occurring every night and during the day, lasting a few minutes to up 30-minutes. Symptoms do wake her up from sleeping and she often tries to shake her hands to wake them up.  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 She endorses mild weakness with opening jars and bottles.  She is not dropping objects.  Symptoms are more frequent and intense on the left hand.  She was recommended to use a wrist splint by her PCP, but she did not use this due to discomfort of the brace.   She has chronic neck and back pain.  Over the past month, she also has fleeting numbness of the abdomen and groin, lasting a few minutes, occurring 4-5 times.  She denies any weakness of the legs.   Out-side paper records, electronic medical record, and images have been reviewed where available and summarized as:  Lab Results  Component Value Date   TSH 1.75 10/05/2016   Lab Results  Component Value Date   [id number] 759 10/05/2016   Lab Results  Component Value Date   HGBA1C 5.5 10/05/2016    Past Medical History:  Diagnosis Date  . Abnormal Pap smear 04/2007, 04/2009, 07/2011   ASCUS   . Chicken pox   . Seasonal allergies     Past Surgical History:    Procedure Laterality Date  . ADENOIDECTOMY    . APPENDECTOMY    . DILATATION & CURETTAGE/HYSTEROSCOPY WITH TRUECLEAR N/A 01/18/2013   Procedure: DILATATION & CURETTAGE/HYSTEROSCOPY WITH TRUECLEAR;  Surgeon: Betsy Coder, MD;  Location: [facility];  Service: Gynecology;  Laterality: N/A;  D&C Hysteroscopy with TruClear  . LEEP    . TONSILLECTOMY    . WISDOM TOOTH EXTRACTION       Medications:  Outpatient Encounter Prescriptions as of 12/30/2016  Medication Sig  . Vitamin D, Ergocalciferol, (DRISDOL) 50000 units CAPS capsule Take 1 capsule (50,000 Units total) by mouth every 7 (seven) days.   No facility-administered encounter medications on file as of 12/30/2016.      Allergies: No Known Allergies  Family History: Family History  Problem Relation Age of Onset  . Heart disease Mother   . Diabetes Mother   . Heart disease Father   . Leukemia Father        died at age 81  . Arthritis Maternal Grandmother   . Stroke Maternal Grandmother   . Diabetes Maternal Grandmother     Social History: Social History  Substance Use Topics  . Smoking status: Light Tobacco Smoker    Last attempt to quit: 08/04/2011  . Smokeless tobacco: Never Used     Comment: Socially  . Alcohol use 0.5 oz/week    1 Standard drinks or equivalent per week  Comment: socially   Social History   Social History Narrative  . No narrative on file    Review of Systems:  CONSTITUTIONAL: No fevers, chills, night sweats, or weight loss.   EYES: No visual changes or eye pain ENT: No hearing changes.  No history of nose bleeds.   RESPIRATORY: No cough, wheezing and shortness of breath.   CARDIOVASCULAR: Negative for chest pain, and palpitations.   GI: Negative for abdominal discomfort, blood in stools or black stools.  No recent change in bowel habits.   GU:  No history of incontinence.   MUSCLOSKELETAL: No history of joint pain or swelling.  No myalgias.   SKIN: Negative for lesions, rash, and itching.    HEMATOLOGY/ONCOLOGY: Negative for prolonged bleeding, bruising easily, and swollen nodes.  No history of cancer.   ENDOCRINE: Negative for cold or heat intolerance, polydipsia or goiter.   PSYCH:  No depression or anxiety symptoms.   NEURO: As Above.   Vital Signs:  BP 100/70   Pulse 81   Ht 5' 4.5" (1.638 m)   Wt 215 lb 5 oz (97.7 kg)   SpO2 98%   BMI 36.39 kg/m    General Medical Exam:   General:  Well appearing, comfortable.   Eyes/ENT: see cranial nerve examination.   Neck: No masses appreciated.  Full range of motion without tenderness.  No carotid bruits. Respiratory:  Clear to auscultation, good air entry bilaterally.   Cardiac:  Regular rate and rhythm, no murmur.   Extremities:  No deformities, edema, or skin discoloration.  Skin:  No rashes or lesions.  Neurological Exam: MENTAL STATUS including orientation to time, place, person, recent and remote memory, attention span and concentration, language, and fund of knowledge is normal.  Speech is not dysarthric.  CRANIAL NERVES: II:  No visual field defects.  Unremarkable fundi.   III-IV-VI: Pupils equal round and reactive to light.  Normal conjugate, extra-ocular eye movements in all directions of gaze.  No nystagmus.  No ptosis.   V:  Normal facial sensation.     VII:  Normal facial symmetry and movements.  No pathologic facial reflexes.  VIII:  Normal hearing and vestibular function.   IX-X:  Normal palatal movement.   XI:  Normal shoulder shrug and head rotation.   XII:  Normal tongue strength and range of motion, no deviation or fasciculation.  MOTOR:  Motor strength is 5/5 throughout including bilateral ABP.  No atrophy, fasciculations or abnormal movements.  No pronator drift.  Tone is normal.    MSRs:  Right                                                                 Left brachioradialis 1+  brachioradialis 1+  biceps 1+  biceps 1+  triceps 1+  triceps 1+  patellar 1+  patellar 1+  ankle jerk 1+  ankle  jerk 1+  Hoffman no  Hoffman no  plantar response down  plantar response down   SENSORY:  Normal and symmetric perception of light touch, pinprick, vibration, and proprioception. No sensory level.  Tinel's negative at the wrist.   COORDINATION/GAIT: Normal finger-to- nose-finger and heel-to-shin.  Intact rapid alternating movements bilaterally.  Gait narrow based and stable.   Diagnostic testing performed today:  NCS/EMG of the upper extremities 12/30/2016: Bilateral median neuropathy at or distal to the wrist, consistent with clinical diagnosis of carpal tunnel syndrome; mild in degree electrically on the left and very mild on the right.  IMPRESSION: Bilateral carpal tunnel syndrome as confirmed on NCS/EMG performed today (see procedure note). CTS is mild on the left and very mild on the right, consistent with her symptoms.  I had extensive discussion with the patient regarding the mechanism and management options for CTS. We decided to pursue conservative therapy with using wrist splints.  If she does not tolerate this, may consider occupational therapy and/or steroid injection going forward.  I am not sure what to make of her transient abdominal Tate. They are very transient and infrequent, so I have asked her to monitor this.  There is no sensory level and reflexes are not increased making spinal cord pathology very low.  Patient was reassured that I did not appreciate anything worrisome on her exam.  Return to clinic as needed   The duration of this appointment visit was 60 minutes of face-to-face time with the patient.  Greater than 50% of this time was spent in counseling, explanation of diagnosis, planning of further management, and coordination of care.   Thank you for allowing me to participate in patient's care.  If I can answer any additional questions, I would be pleased to do so.    Sincerely,    Meosha Castanon K. Posey Pronto, DO

## 2016-12-30 NOTE — Procedures (Signed)
[facility]  [place]  [place], [place] [address] Tel: [phone] Fax:  [phone] Test Date:  12/30/2016  Patient: Wendy Tate DOB: 10-Sep-1975 Physician: Narda Amber, DO  Sex: Female Height: 5' 4"  Ref Phys: Narda Amber, DO  ID#: [id number] Temp: 36.7C Technician:    Patient Complaints: This is a 41 year-old female referred for evaluation of hand paresthesia, worse on the left.  NCV & EMG Findings: Extensive electrodiagnostic testing of the left upper extremity and additional studies of the right shows:  1. Left median sensory response shows prolonged distal peak latency (3.8 ms).  Right median and bilateral ulnar sensory responses are within normal limits. Right mixed palmer responses show absolute prolongation in latency.  2. Bilateral median and ulnar motor responses are within normal limits.   Impression: Bilateral median neuropathy at or distal to the wrist, consistent with clinical diagnosis of carpal tunnel syndrome; mild in degree electrically on the left and very mild on the right.   ___________________________ Narda Amber, DO    Nerve Conduction Studies Anti Sensory Summary Table   Site NR Peak (ms) Norm Peak (ms) P-T Amp (V) Norm P-T Amp  Left Median Anti Sensory (2nd Digit)  36.7C  Wrist    3.8 <3.4 29.6 >20  Right Median Anti Sensory (2nd Digit)  36.7C  Wrist    3.2 <3.4 34.0 >20  Left Ulnar Anti Sensory (5th Digit)  36.7C  Wrist    2.3 <3.1 31.7 >12  Right Ulnar Anti Sensory (5th Digit)  36.7C  Wrist    2.4 <3.1 35.9 >12   Motor Summary Table   Site NR Onset (ms) Norm Onset (ms) O-P Amp (mV) Norm O-P Amp Site1 Site2 Delta-0 (ms) Dist (cm) Vel (m/s) Norm Vel (m/s)  Left Median Motor (Abd Poll Brev)  36.7C  Wrist    3.1 <3.9 8.9 >6 Elbow Wrist 4.1 26.0 63 >50  Elbow    7.2  8.1         Right Median Motor (Abd Poll Brev)  36.7C  Wrist    3.0 <3.9 11.8 >6 Elbow Wrist 3.9 25.0 64 >50  Elbow    6.9  11.6         Left  Ulnar Motor (Abd Dig Minimi)  36.7C  Wrist    1.7 <3.1 10.2 >7 B Elbow Wrist 3.3 21.0 64 >50  B Elbow    5.0  9.6  A Elbow B Elbow 1.6 10.0 63 >50  A Elbow    6.6  9.0         Right Ulnar Motor (Abd Dig Minimi)  36.7C  Wrist    2.0 <3.1 9.7 >7 B Elbow Wrist 3.2 23.0 72 >50  B Elbow    5.2  9.4  A Elbow B Elbow 1.6 10.0 63 >50  A Elbow    6.8  9.2          Comparison Summary Table   Site NR Peak (ms) Norm Peak (ms) P-T Amp (V) Site1 Site2 Delta-P (ms) Norm Delta (ms)  Right Median/Ulnar Palm Comparison (Wrist - 8cm)  36.7C  Median Palm    2.1 <2.2 57.9 Median Palm Ulnar Palm 0.9   Ulnar Palm    1.2 <2.2 21.0       EMG   Side Muscle Ins Act Fibs Psw Fasc Number Recrt Dur Dur. Amp Amp. Poly Poly. Comment  Right 1stDorInt Nml Nml Nml Nml Nml Nml Nml Nml Nml Nml Nml Nml  N/A  Right Abd Poll Brev Nml Nml Nml Nml Nml Nml Nml Nml Nml Nml Nml Nml N/A  Right PronatorTeres Nml Nml Nml Nml Nml Nml Nml Nml Nml Nml Nml Nml N/A  Left 1stDorInt Nml Nml Nml Nml Nml Nml Nml Nml Nml Nml Nml Nml N/A  Left Abd Poll Brev Nml Nml Nml Nml Nml Nml Nml Nml Nml Nml Nml Nml N/A  Left Ext Indicis Nml Nml Nml Nml Nml Nml Nml Nml Nml Nml Nml Nml N/A  Left PronatorTeres Nml Nml Nml Nml Nml Nml Nml Nml Nml Nml Nml Nml N/A  Left Biceps Nml Nml Nml Nml Nml Nml Nml Nml Nml Nml Nml Nml N/A  Left Triceps Nml Nml Nml Nml Nml Nml Nml Nml Nml Nml Nml Nml N/A  Left Deltoid Nml Nml Nml Nml Nml Nml Nml Nml Nml Nml Nml Nml N/A      Waveforms:

## 2016-12-30 NOTE — Patient Instructions (Signed)
Start using a wrist splint during the night and during the day as needed  Try to avoid over flexing at the wrist   Carpal Tunnel Syndrome Carpal tunnel syndrome is a condition that causes pain in your hand and arm. The carpal tunnel is a narrow area located on the palm side of your wrist. Repeated wrist motion or certain diseases may cause swelling within the tunnel. This swelling pinches the main nerve in the wrist (median nerve). What are the causes? This condition may be caused by:  Repeated wrist motions.  Wrist injuries.  Arthritis.  A cyst or tumor in the carpal tunnel.  Fluid buildup during pregnancy.  Sometimes the cause of this condition is not known. What increases the risk? This condition is more likely to develop in:  People who have jobs that cause them to repeatedly move their wrists in the same motion, such as Art gallery manager.  Women.  People with certain conditions, such as: ? Diabetes. ? Obesity. ? An underactive thyroid (hypothyroidism). ? Kidney failure.  What are the signs or symptoms? Symptoms of this condition include:  A tingling feeling in your fingers, especially in your thumb, index, and middle fingers.  Tingling or numbness in your hand.  An aching feeling in your entire arm, especially when your wrist and elbow are bent for long periods of time.  Wrist pain that goes up your arm to your shoulder.  Pain that goes down into your palm or fingers.  A weak feeling in your hands. You may have trouble grabbing and holding items.  Your symptoms may feel worse during the night. How is this diagnosed? This condition is diagnosed with a medical history and physical exam. You may also have tests, including:  An electromyogram (EMG). This test measures electrical signals sent by your nerves into the muscles.  X-rays.  How is this treated? Treatment for this condition includes:  Lifestyle changes. It is important to stop doing or modify  the activity that caused your condition.  Physical or occupational therapy.  Medicines for pain and inflammation. This may include medicine that is injected into your wrist.  A wrist splint.  Surgery.  Follow these instructions at home: If you have a splint:  Wear it as told by your health care provider. Remove it only as told by your health care provider.  Loosen the splint if your fingers become numb and tingle, or if they turn cold and blue.  Keep the splint clean and dry. General instructions  Take over-the-counter and prescription medicines only as told by your health care provider.  Rest your wrist from any activity that may be causing your pain. If your condition is work related, talk to your employer about changes that can be made, such as getting a wrist pad to use while typing.  If directed, apply ice to the painful area: ? Put ice in a plastic bag. ? Place a towel between your skin and the bag. ? Leave the ice on for 20 minutes, 2-3 times per day.  Keep all follow-up visits as told by your health care provider. This is important.  Do any exercises as told by your health care provider, physical therapist, or occupational therapist. Contact a health care provider if:  You have new symptoms.  Your pain is not controlled with medicines.  Your symptoms get worse. This information is not intended to replace advice given to you by your health care provider. Make sure you discuss any questions you have  with your health care provider. Document Released: 05/01/2000 Document Revised: 09/12/2015 Document Reviewed: 09/19/2014 Elsevier Interactive Patient Education  2017 [employer].

## 2017-01-01 ENCOUNTER — Other Ambulatory Visit: Payer: Self-pay | Admitting: Family Medicine

## 2017-01-01 DIAGNOSIS — E559 Vitamin D deficiency, unspecified: Secondary | ICD-10-CM

## 2017-01-01 NOTE — Telephone Encounter (Signed)
Pt completed 12 weeks of Ergocalciferol, please advise.

## 2017-01-04 ENCOUNTER — Encounter: Payer: Self-pay | Admitting: Family Medicine

## 2017-11-20 NOTE — Progress Notes (Addendum)
[facility] [address] [place], [place] [address] [phone]  Date:  11/24/2017   Name:  Wendy Tate   DOB:  [birth year]   MRN:  [id number]  PCP:  Darreld Mclean, MD    Chief Complaint: Annual Exam (no concerns, needs pap)   History of Present Illness:  Wendy Tate is a 42 y.o. very pleasant female patient who presents with the following:  Here today or a CPE History of obesity and abnormal pap in the past, history of vitamin D def I saw her about a year ago with CTS symptoms.  From another previous note: Here today to establish as a new patient.  Her usual health care provider is Wendy Tate who is a Proofreader at [employer].   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   She is married, no children.  She enjoys doing crafts in her free time, and she also enjoys being out in nature.   Admits that she does not get a lot of exercise but she has started an at home walking DVD this week.  She has also considered joining a gym She is fasting today for labs and recently had a CMP/ CBC done per [facility] weight loss- this is a weight loss program that is popular in our area.  She does need a TSH and lipids still She may spoke an occasional clove cigar.  Maybe 2 a month  Labs: about a year ago, she is fasting today Mammo: she had this done last year, done at [facility] Pap: will do today. She had one last year but the sample failed and was not run. She did have a LEEP procedure years ago, she is not quite sure when this was.  However more recent paps have been ok Immun:  UTD   She is traveling to the beach next month  She is walking outside for exercise- maybe 4-5 days a week  No tobacco in a month Wt Readings from Last 3 Encounters:  11/24/17 196 lb  (88.9 kg)  12/30/16 215 lb 5 oz (97.7 kg)  10/05/16 211 lb 12.8 oz (96.1 kg)   She is using intermittent fasting and has lost weight!  No CP or SOB   Her partner is Wendy Tate No children Patient Active Problem List   Diagnosis Date Noted  . Bilateral carpal tunnel syndrome 12/30/2016  . Obesity 03/18/2016  . ASCUS (atypical squamous cells of undetermined significance) on Pap smear 02/16/2012  . Hx LEEP (loop electrosurgical excision procedure 02/16/2012    Past Medical History:  Diagnosis Date  . Abnormal Pap smear 04/2007, 04/2009, 07/2011   ASCUS   . Chicken pox   . Seasonal allergies     Past Surgical History:  Procedure Laterality Date  . ADENOIDECTOMY    . APPENDECTOMY    . DILATATION & CURETTAGE/HYSTEROSCOPY WITH TRUECLEAR N/A 01/18/2013   Procedure: DILATATION & CURETTAGE/HYSTEROSCOPY WITH TRUECLEAR;  Surgeon: Betsy Coder, MD;  Location: [facility];  Service: Gynecology;  Laterality: N/A;  D&C Hysteroscopy with TruClear  . LEEP    . TONSILLECTOMY    . WISDOM TOOTH EXTRACTION      Social History   Tobacco Use  . Smoking status: Light Tobacco Smoker    Last  attempt to quit: 08/04/2011    Years since quitting: 6.3  . Smokeless tobacco: Never Used  . Tobacco comment: Socially  Substance Use Topics  . Alcohol use: Yes    Alcohol/week: 0.6 oz    Types: 1 Standard drinks or equivalent per week    Comment: socially  . Drug use: No    Comment: hx.    Family History  Problem Relation Age of Onset  . Heart disease Mother   . Diabetes Mother   . Heart disease Father   . Leukemia Father        died at age 3  . Arthritis Maternal Grandmother   . Stroke Maternal Grandmother   . Diabetes Maternal Grandmother     No Known Allergies  Medication list has been reviewed and updated.  Current Outpatient Medications on File Prior to Visit  Medication Sig Dispense Refill  . Vitamin D, Ergocalciferol, (DRISDOL) 50000 units CAPS capsule Take 1 capsule (50,000 Units  total) by mouth every 7 (seven) days. 12 capsule 0   No current facility-administered medications on file prior to visit.     Review of Systems:  As per HPI- otherwise negative. No fever or chills No CP or SOB with exercise    Physical Examination: Vitals:   11/24/17 1033  BP: 114/70  Pulse: 88  Resp: 16  SpO2: 98%   Vitals:   11/24/17 1033  Weight: 196 lb (88.9 kg)  Height: 5' 4.5" (1.638 m)   Body mass index is 33.12 kg/m. Ideal Body Weight: Weight in (lb) to have BMI = 25: 147.6  GEN: WDWN, NAD, Non-toxic, A & O x 3, overweight, looks well  HEENT: Atraumatic, Normocephalic. Neck supple. No masses, No LAD.  Bilateral TM wnl, oropharynx normal.  PEERL,EOMI.   Ears and Nose: No external deformity. CV: RRR, No M/G/R. No JVD. No thrill. No extra heart sounds. PULM: CTA B, no wheezes, crackles, rhonchi. No retractions. No resp. distress. No accessory muscle use. ABD: S, NT, ND, +BS EXTR: No c/c/e NEURO Normal gait.  PSYCH: Normally interactive. Conversant. Not depressed or anxious appearing.  Calm demeanor.  Breast: normal exam, no masses/ dimpling/ discharge Pelvic: normal, no vaginal lesions or discharge. Uterus normal, no CMT, no adnexal tendereness or masses   Assessment and Plan: Physical exam  Screening for deficiency anemia - Plan: CBC  Screening for diabetes mellitus - Plan: Comprehensive metabolic panel, Hemoglobin A1c  Vitamin D deficiency - Plan: Vitamin D (25 hydroxy)  Screening for hyperlipidemia - Plan: Lipid panel  Screening for cervical cancer - Plan: Cytology - PAP  Screening for breast cancer - Plan: MM 3D SCREEN BREAST BILATERAL  CPE today Pap, mammo ordered Labs pending  She has lost weight- encouraged her continued progress  Will plan further follow- up pending labs.   Signed Wendy Blinks, MD  Received her labs, message to pt  Results for orders placed or performed in visit on 11/24/17  CBC  Result Value Ref Range   WBC 7.4  4.0 - 10.5 K/uL   RBC 4.63 3.87 - 5.11 Mil/uL   Platelets 247.0 150.0 - 400.0 K/uL   Hemoglobin 14.5 12.0 - 15.0 g/dL   HCT 42.8 36.0 - 46.0 %   MCV 92.6 78.0 - 100.0 fl   MCHC 33.8 30.0 - 36.0 g/dL   RDW 12.8 11.5 - 15.5 %  Comprehensive metabolic panel  Result Value Ref Range   Sodium 141 135 - 145 mEq/L   Potassium 4.3 3.5 - 5.1  mEq/L   Chloride 102 96 - 112 mEq/L   CO2 30 19 - 32 mEq/L   Glucose, Bld 89 70 - 99 mg/dL   BUN 8 6 - 23 mg/dL   Creatinine, Ser 0.80 0.40 - 1.20 mg/dL   Total Bilirubin 0.4 0.2 - 1.2 mg/dL   Alkaline Phosphatase 69 39 - 117 U/L   AST 17 0 - 37 U/L   ALT 19 0 - 35 U/L   Total Protein 6.8 6.0 - 8.3 g/dL   Albumin 4.6 3.5 - 5.2 g/dL   Calcium 9.7 8.4 - 10.5 mg/dL   GFR 83.09 >60.00 mL/min  Hemoglobin A1c  Result Value Ref Range   Hgb A1c MFr Bld 5.8 4.6 - 6.5 %  Lipid panel  Result Value Ref Range   Cholesterol 228 (H) 0 - 200 mg/dL   Triglycerides 129.0 0.0 - 149.0 mg/dL   HDL 48.70 >39.00 mg/dL   VLDL 25.8 0.0 - 40.0 mg/dL   LDL Cholesterol 153 (H) 0 - 99 mg/dL   Total CHOL/HDL Ratio 5    NonHDL 179.12   Vitamin D (25 hydroxy)  Result Value Ref Range   VITD 25.31 (L) 30.00 - 100.00 ng/mL   Blood counts are normal Metabolic profile is normal Average blood sugar- A1c test- is just barely in the pre-diabetes range.  Continue your exercise and weight loss efforts and we will monitor this Cholesterol is about the same as last year - a bit high.  However I calculated your estimated 10 year risk of cardiovascular disease at under 3%, which is of course favorable.  At this time I don't feel strongly that you need a cholesterol med, but if you would like to use one we could rx for you. Vitamin D is slightly low- better than last year.  I would recommend that you take 2,000 IU of vitamin D daily, available OTC.  If you are already doing this please let me know!  I completed your form and gave to my nurse to fax tomorrow. We can plan to do a physical  in one year.  Take care! Received her labs and completed form, put in nurse box to fax   Also received pap 7/12 Adequacy Satisfactory for evaluation endocervical/transformation zone component PRESENT.   Diagnosis NEGATIVE FOR INTRAEPITHELIAL LESIONS OR MALIGNANCY.   HPV NOT DETECTED   Comment: Normal Reference Range - NOT Detected  Material Submitted CervicoVaginal Pap [ThinPrep Imaged   Message to pt

## 2017-11-24 ENCOUNTER — Encounter: Payer: Self-pay | Admitting: Family Medicine

## 2017-11-24 ENCOUNTER — Ambulatory Visit (INDEPENDENT_AMBULATORY_CARE_PROVIDER_SITE_OTHER): Payer: 59 | Admitting: Family Medicine

## 2017-11-24 ENCOUNTER — Other Ambulatory Visit (HOSPITAL_COMMUNITY)
Admission: RE | Admit: 2017-11-24 | Discharge: 2017-11-24 | Disposition: A | Discharge status: Home / Self Care | Payer: 59 | Source: Ambulatory Visit | Attending: Family Medicine | Admitting: Family Medicine

## 2017-11-24 VITALS — BP 114/70 | HR 88 | Resp 16 | Ht 64.5 in | Wt 196.0 lb

## 2017-11-24 DIAGNOSIS — E559 Vitamin D deficiency, unspecified: Secondary | ICD-10-CM

## 2017-11-24 DIAGNOSIS — Z1231 Encounter for screening mammogram for malignant neoplasm of breast: Secondary | ICD-10-CM

## 2017-11-24 DIAGNOSIS — Z0000 Encounter for general adult medical examination without abnormal findings: Secondary | ICD-10-CM | POA: Diagnosis not present

## 2017-11-24 DIAGNOSIS — Z13220 Encounter for screening for lipoid disorders: Secondary | ICD-10-CM

## 2017-11-24 DIAGNOSIS — Z130 Encounter for screening for diseases of the blood and blood-forming organs and certain disorders involving the immune mechanism: Secondary | ICD-10-CM | POA: Diagnosis not present

## 2017-11-24 DIAGNOSIS — Z124 Encounter for screening for malignant neoplasm of cervix: Secondary | ICD-10-CM

## 2017-11-24 DIAGNOSIS — Z1239 Encounter for other screening for malignant neoplasm of breast: Secondary | ICD-10-CM

## 2017-11-24 DIAGNOSIS — Z131 Encounter for screening for diabetes mellitus: Secondary | ICD-10-CM | POA: Diagnosis not present

## 2017-11-24 LAB — COMPREHENSIVE METABOLIC PANEL
ALK PHOS: 69 U/L (ref 39–117)
ALT: 19 U/L (ref 0–35)
AST: 17 U/L (ref 0–37)
Albumin: 4.6 g/dL (ref 3.5–5.2)
BUN: 8 mg/dL (ref 6–23)
CHLORIDE: 102 meq/L (ref 96–112)
CO2: 30 meq/L (ref 19–32)
Calcium: 9.7 mg/dL (ref 8.4–10.5)
Creatinine, Ser: 0.8 mg/dL (ref 0.40–1.20)
GFR: 83.09 mL/min (ref >60.00)
GLUCOSE: 89 mg/dL (ref 70–99)
POTASSIUM: 4.3 meq/L (ref 3.5–5.1)
SODIUM: 141 meq/L (ref 135–145)
Total Bilirubin: 0.4 mg/dL (ref 0.2–1.2)
Total Protein: 6.8 g/dL (ref 6.0–8.3)

## 2017-11-24 LAB — CBC
HEMATOCRIT: 42.8 % (ref 36.0–46.0)
Hemoglobin: 14.5 g/dL (ref 12.0–15.0)
MCHC: 33.8 g/dL (ref 30.0–36.0)
MCV: 92.6 fl (ref 78.0–100.0)
Platelets: 247 10*3/uL (ref 150.0–400.0)
RBC: 4.63 Mil/uL (ref 3.87–5.11)
RDW: 12.8 % (ref 11.5–15.5)
WBC: 7.4 10*3/uL (ref 4.0–10.5)

## 2017-11-24 LAB — VITAMIN D 25 HYDROXY (VIT D DEFICIENCY, FRACTURES): VITD: 25.31 ng/mL — ABNORMAL LOW (ref 30.00–100.00)

## 2017-11-24 LAB — LIPID PANEL
CHOL/HDL RATIO: 5
Cholesterol: 228 mg/dL — ABNORMAL HIGH (ref 0–200)
HDL: 48.7 mg/dL (ref >39.00)
LDL Cholesterol: 153 mg/dL — ABNORMAL HIGH (ref 0–99)
NONHDL: 179.12
Triglycerides: 129 mg/dL (ref 0.0–149.0)
VLDL: 25.8 mg/dL (ref 0.0–40.0)

## 2017-11-24 LAB — HEMOGLOBIN A1C: Hgb A1c MFr Bld: 5.8 % (ref 4.6–6.5)

## 2017-11-24 NOTE — Patient Instructions (Addendum)
Great to see you today as always!  [place].  I'll be in touch with your labs Keep up the good work with diet and exercise I will fax in your form for you when your labs come in  Health Maintenance, Female Adopting a healthy lifestyle and getting preventive care can go a long way to promote health and wellness. Talk with your health care provider about what schedule of regular examinations is right for you. This is a good chance for you to check in with your provider about disease prevention and staying healthy. In between checkups, there are plenty of things you can do on your own. Experts have done a lot of research about which lifestyle changes and preventive measures are most likely to keep you healthy. Ask your health care provider for more information. Weight and diet Eat a healthy diet  Be sure to include plenty of vegetables, fruits, low-fat dairy products, and lean protein.  Do not eat a lot of foods high in solid fats, added sugars, or salt.  Get regular exercise. This is one of the most important things you can do for your health. ? Most adults should exercise for at least 150 minutes each week. The exercise should increase your heart rate and make you sweat (moderate-intensity exercise). ? Most adults should also do strengthening exercises at least twice a week. This is in addition to the moderate-intensity exercise.  Maintain a healthy weight  Body mass index (BMI) is a measurement that can be used to identify possible weight problems. It estimates body fat based on height and weight. Your health care provider can help determine your BMI and help you achieve or maintain a healthy weight.  For females 52 years of age and older: ? A BMI below 18.5 is considered underweight. ? A BMI of 18.5 to 24.9 is normal. ? A BMI of 25 to 29.9 is considered overweight. ? A BMI of 30 and above is considered obese.  Watch levels of cholesterol and blood  lipids  You should start having your blood tested for lipids and cholesterol at 43 years of age, then have this test every 5 years.  You may need to have your cholesterol levels checked more often if: ? Your lipid or cholesterol levels are high. ? You are older than 43 years of age. ? You are at high risk for heart disease.  Cancer screening Lung Cancer  Lung cancer screening is recommended for adults 84-51 years old who are at high risk for lung cancer because of a history of smoking.  A yearly low-dose CT scan of the lungs is recommended for people who: ? Currently smoke. ? Have quit within the past 15 years. ? Have at least a 30-pack-year history of smoking. A pack year is smoking an average of one pack of cigarettes a day for 1 year.  Yearly screening should continue until it has been 15 years since you quit.  Yearly screening should stop if you develop a health problem that would prevent you from having lung cancer treatment.  Breast Cancer  Practice breast self-awareness. This means understanding how your breasts normally appear and feel.  It also means doing regular breast self-exams. Let your health care provider know about any changes, no matter how small.  If you are in your 20s or 30s, you should have a clinical breast exam (CBE) by a health care provider every 1-3 years as part of a regular  health exam.  If you are 40 or older, have a CBE every year. Also consider having a breast X-ray (mammogram) every year.  If you have a family history of breast cancer, talk to your health care provider about genetic screening.  If you are at high risk for breast cancer, talk to your health care provider about having an MRI and a mammogram every year.  Breast cancer gene (BRCA) assessment is recommended for women who have family members with BRCA-related cancers. BRCA-related cancers include: ? Breast. ? Ovarian. ? Tubal. ? Peritoneal cancers.  Results of the assessment will  determine the need for genetic counseling and BRCA1 and BRCA2 testing.  Cervical Cancer Your health care provider may recommend that you be screened regularly for cancer of the pelvic organs (ovaries, uterus, and vagina). This screening involves a pelvic examination, including checking for microscopic changes to the surface of your cervix (Pap test). You may be encouraged to have this screening done every 3 years, beginning at age 75.  For women ages 30-65, health care providers may recommend pelvic exams and Pap testing every 3 years, or they may recommend the Pap and pelvic exam, combined with testing for human papilloma virus (HPV), every 5 years. Some types of HPV increase your risk of cervical cancer. Testing for HPV may also be done on women of any age with unclear Pap test results.  Other health care providers may not recommend any screening for nonpregnant women who are considered low risk for pelvic cancer and who do not have symptoms. Ask your health care provider if a screening pelvic exam is right for you.  If you have had past treatment for cervical cancer or a condition that could lead to cancer, you need Pap tests and screening for cancer for at least 20 years after your treatment. If Pap tests have been discontinued, your risk factors (such as having a new sexual partner) need to be reassessed to determine if screening should resume. Some women have medical problems that increase the chance of getting cervical cancer. In these cases, your health care provider may recommend more frequent screening and Pap tests.  Colorectal Cancer  This type of cancer can be detected and often prevented.  Routine colorectal cancer screening usually begins at 43 years of age and continues through 42 years of age.  Your health care provider may recommend screening at an earlier age if you have risk factors for colon cancer.  Your health care provider may also recommend using home test kits to check  for hidden blood in the stool.  A small camera at the end of a tube can be used to examine your colon directly (sigmoidoscopy or colonoscopy). This is done to check for the earliest forms of colorectal cancer.  Routine screening usually begins at age 81.  Direct examination of the colon should be repeated every 5-10 years through 43 years of age. However, you may need to be screened more often if early forms of precancerous polyps or small growths are found.  Skin Cancer  Check your skin from head to toe regularly.  Tell your health care provider about any new moles or changes in moles, especially if there is a change in a mole's shape or color.  Also tell your health care provider if you have a mole that is larger than the size of a pencil eraser.  Always use sunscreen. Apply sunscreen liberally and repeatedly throughout the day.  Protect yourself by wearing long sleeves, pants, a  wide-brimmed hat, and sunglasses whenever you are outside.  Heart disease, diabetes, and high blood pressure  High blood pressure causes heart disease and increases the risk of stroke. High blood pressure is more likely to develop in: ? People who have blood pressure in the high end of the normal range (130-139/85-89 mm Hg). ? People who are overweight or obese. ? People who are African American.  If you are 33-38 years of age, have your blood pressure checked every 3-5 years. If you are 47 years of age or older, have your blood pressure checked every year. You should have your blood pressure measured twice-once when you are at a hospital or clinic, and once when you are not at a hospital or clinic. Record the average of the two measurements. To check your blood pressure when you are not at a hospital or clinic, you can use: ? An automated blood pressure machine at a pharmacy. ? A home blood pressure monitor.  If you are between 2 years and 60 years old, ask your health care provider if you should take  aspirin to prevent strokes.  Have regular diabetes screenings. This involves taking a blood sample to check your fasting blood sugar level. ? If you are at a normal weight and have a low risk for diabetes, have this test once every three years after 43 years of age. ? If you are overweight and have a high risk for diabetes, consider being tested at a younger age or more often. Preventing infection Hepatitis B  If you have a higher risk for hepatitis B, you should be screened for this virus. You are considered at high risk for hepatitis B if: ? You were born in a country where hepatitis B is common. Ask your health care provider which countries are considered high risk. ? Your parents were born in a high-risk country, and you have not been immunized against hepatitis B (hepatitis B vaccine). ? You have HIV or AIDS. ? You use needles to inject street drugs. ? You live with someone who has hepatitis B. ? You have had sex with someone who has hepatitis B. ? You get hemodialysis treatment. ? You take certain medicines for conditions, including cancer, organ transplantation, and autoimmune conditions.  Hepatitis C  Blood testing is recommended for: ? Everyone born from [birth year] through 1966. ? Anyone with known risk factors for hepatitis C.  Sexually transmitted infections (STIs)  You should be screened for sexually transmitted infections (STIs) including gonorrhea and chlamydia if: ? You are sexually active and are younger than 43 years of age. ? You are older than 43 years of age and your health care provider tells you that you are at risk for this type of infection. ? Your sexual activity has changed since you were last screened and you are at an increased risk for chlamydia or gonorrhea. Ask your health care provider if you are at risk.  If you do not have HIV, but are at risk, it may be recommended that you take a prescription medicine daily to prevent HIV infection. This is called  pre-exposure prophylaxis (PrEP). You are considered at risk if: ? You are sexually active and do not regularly use condoms or know the HIV status of your partner(s). ? You take drugs by injection. ? You are sexually active with a partner who has HIV.  Talk with your health care provider about whether you are at high risk of being infected with HIV. If you choose to  begin PrEP, you should first be tested for HIV. You should then be tested every 3 months for as long as you are taking PrEP. Pregnancy  If you are premenopausal and you may become pregnant, ask your health care provider about preconception counseling.  If you may become pregnant, take 400 to 800 micrograms (mcg) of folic acid every day.  If you want to prevent pregnancy, talk to your health care provider about birth control (contraception). Osteoporosis and menopause  Osteoporosis is a disease in which the bones lose minerals and strength with aging. This can result in serious bone fractures. Your risk for osteoporosis can be identified using a bone density scan.  If you are 9 years of age or older, or if you are at risk for osteoporosis and fractures, ask your health care provider if you should be screened.  Ask your health care provider whether you should take a calcium or vitamin D supplement to lower your risk for osteoporosis.  Menopause may have certain physical symptoms and risks.  Hormone replacement therapy may reduce some of these symptoms and risks. Talk to your health care provider about whether hormone replacement therapy is right for you. Follow these instructions at home:  Schedule regular health, dental, and eye exams.  Stay current with your immunizations.  Do not use any tobacco products including cigarettes, chewing tobacco, or electronic cigarettes.  If you are pregnant, do not drink alcohol.  If you are breastfeeding, limit how much and how often you drink alcohol.  Limit alcohol intake to no more  than 1 drink per day for nonpregnant women. One drink equals 12 ounces of beer, 5 ounces of wine, or 1 ounces of hard liquor.  Do not use street drugs.  Do not share needles.  Ask your health care provider for help if you need support or information about quitting drugs.  Tell your health care provider if you often feel depressed.  Tell your health care provider if you have ever been abused or do not feel safe at home. This information is not intended to replace advice given to you by your health care provider. Make sure you discuss any questions you have with your health care provider. Document Released: 11/17/2010 Document Revised: 10/10/2015 Document Reviewed: 02/05/2015 Elsevier Interactive Patient Education  [employer].

## 2017-11-25 LAB — CYTOLOGY - PAP
DIAGNOSIS: NEGATIVE
HPV (WINDOPATH): NOT DETECTED

## 2017-11-26 ENCOUNTER — Encounter: Payer: Self-pay | Admitting: Family Medicine

## 2018-01-22 ENCOUNTER — Ambulatory Visit (HOSPITAL_BASED_OUTPATIENT_CLINIC_OR_DEPARTMENT_OTHER): Payer: 59

## 2018-01-29 ENCOUNTER — Ambulatory Visit (HOSPITAL_BASED_OUTPATIENT_CLINIC_OR_DEPARTMENT_OTHER)
Admission: RE | Admit: 2018-01-29 | Discharge: 2018-01-29 | Disposition: A | Discharge status: Home / Self Care | Payer: 59 | Source: Ambulatory Visit | Attending: Family Medicine | Admitting: Family Medicine

## 2018-01-29 DIAGNOSIS — Z1231 Encounter for screening mammogram for malignant neoplasm of breast: Secondary | ICD-10-CM | POA: Diagnosis not present

## 2018-01-29 DIAGNOSIS — Z1239 Encounter for other screening for malignant neoplasm of breast: Secondary | ICD-10-CM

## 2018-11-10 ENCOUNTER — Ambulatory Visit (HOSPITAL_COMMUNITY)
Admission: EM | Admit: 2018-11-10 | Discharge: 2018-11-10 | Disposition: A | Discharge status: Home / Self Care | Payer: 59

## 2018-11-10 ENCOUNTER — Encounter (HOSPITAL_COMMUNITY): Payer: Self-pay | Admitting: Emergency Medicine

## 2018-11-10 ENCOUNTER — Other Ambulatory Visit: Payer: Self-pay

## 2018-11-10 DIAGNOSIS — M5489 Other dorsalgia: Secondary | ICD-10-CM | POA: Diagnosis not present

## 2018-11-10 DIAGNOSIS — M546 Pain in thoracic spine: Secondary | ICD-10-CM | POA: Diagnosis not present

## 2018-11-10 LAB — POCT URINALYSIS DIP (DEVICE)
Bilirubin Urine: NEGATIVE
Glucose, UA: NEGATIVE mg/dL
Hgb urine dipstick: NEGATIVE
Ketones, ur: NEGATIVE mg/dL
Leukocytes,Ua: NEGATIVE
Nitrite: NEGATIVE
Protein, ur: NEGATIVE mg/dL
Specific Gravity, Urine: <=1.005 (ref 1.005–1.030)
Urobilinogen, UA: 0.2 mg/dL (ref 0.0–1.0)
pH: 6.5 (ref 5.0–8.0)

## 2018-11-10 MED ORDER — METHOCARBAMOL 500 MG PO TABS: 500.0000 mg | ORAL_TABLET | Freq: Two times a day (BID) | ORAL | 0 refills | Status: DC

## 2018-11-10 MED ORDER — KETOROLAC TROMETHAMINE 30 MG/ML IJ SOLN
INTRAMUSCULAR | Status: AC
  Filled 2018-11-10: qty 1

## 2018-11-10 MED ORDER — KETOROLAC TROMETHAMINE 30 MG/ML IJ SOLN
30.0000 mg | Freq: Once | INTRAMUSCULAR | Status: AC
  Administered 2018-11-10: 30 mg via INTRAMUSCULAR

## 2018-11-10 NOTE — ED Triage Notes (Signed)
Yesterday patient woke with severe back pain.  Pain never went away.  This back pain is different than usual.  Last night temp 97.8, feeling hot and chills.    Patient has noticed urgency.   Toes on right foot go numb intermittently.

## 2018-11-10 NOTE — Discharge Instructions (Addendum)
Your urine did not show any signs of blood or infection. There is a very small chance that this could be a kidney stone. I am not seeing anything else concerning on your exam today. We will treat this as a muscle spasm/strain and gave you a muscle relaxant.  I am also giving you a shot of Toradol here for pain.  You can continue using the heat and ice. If your symptoms worsen or do not get better you will need to go to the hospital.

## 2018-11-11 NOTE — ED Provider Notes (Addendum)
[facility]    CSN: [id number] Arrival date & time: 11/10/18  4098     History   Chief Complaint Chief Complaint  Patient presents with  . Appointment    8:30 am  . Back Pain    HPI Wendy Tate is a 43 y.o. female.   Patient is a 43 year old female who presents today with severe left back pain.  She woke up this way yesterday.  Denies any injuries to the back.  She took Tylenol and ibuprofen without much relief of the pain.  Rates pain 6 out of 10 currently.  Denies any associated fevers.  Denies any numbness or tingling.  She has been having chills and hot flashes.  She also noticed some urinary urgency and was concerned for kidney infection.  Denies any dysuria, hematuria.  Denies any vaginal discharge, vaginal bleeding.  No abdominal pain, pelvic pain.  She mentioned her and her spouse doing a log of outside work approximately 1 week ago.  She did note some soreness after that but that subsided.  ROS per HPI      Past Medical History:  Diagnosis Date  . Abnormal Pap smear 04/2007, 04/2009, 07/2011   ASCUS   . Chicken pox   . Seasonal allergies     Patient Active Problem List   Diagnosis Date Noted  . Bilateral carpal tunnel syndrome 12/30/2016  . Obesity 03/18/2016  . ASCUS (atypical squamous cells of undetermined significance) on Pap smear 02/16/2012  . Hx LEEP (loop electrosurgical excision procedure 02/16/2012    Past Surgical History:  Procedure Laterality Date  . ADENOIDECTOMY    . APPENDECTOMY    . DILATATION & CURETTAGE/HYSTEROSCOPY WITH TRUECLEAR N/A 01/18/2013   Procedure: DILATATION & CURETTAGE/HYSTEROSCOPY WITH TRUECLEAR;  Surgeon: Betsy Coder, MD;  Location: [facility];  Service: Gynecology;  Laterality: N/A;  D&C Hysteroscopy with TruClear  . LEEP    . TONSILLECTOMY    . WISDOM TOOTH EXTRACTION      OB History    Gravida  0   Para      Term      Preterm      AB      Living        SAB      TAB      Ectopic      Multiple      Live Births               Home Medications    Prior to Admission medications   Medication Sig Start Date End Date Taking? Authorizing Provider  Black Pepper-Turmeric (TURMERIC CURCUMIN) 09-998 MG CAPS Take by mouth.   Yes [provider]  ibuprofen (ADVIL) 200 MG tablet Take 200 mg by mouth every 6 (six) hours as needed.   Yes [provider]  naproxen sodium (ALEVE) 220 MG tablet Take 220 mg by mouth.   Yes [provider]  Vitamin D, Ergocalciferol, (DRISDOL) 50000 units CAPS capsule Take 1 capsule (50,000 Units total) by mouth every 7 (seven) days. 10/06/16  Yes Copland, Gay Filler, MD  methocarbamol (ROBAXIN) 500 MG tablet Take 1 tablet (500 mg total) by mouth 2 (two) times daily. 11/10/18   Orvan July, NP    Family History Family History  Problem Relation Age of Onset  . Heart disease Mother   . Diabetes Mother   . Heart disease Father   . Leukemia Father        died at age 23  .  Arthritis Maternal Grandmother   . Stroke Maternal Grandmother   . Diabetes Maternal Grandmother     Social History Social History   Tobacco Use  . Smoking status: Light Tobacco Smoker    Last attempt to quit: 08/04/2011    Years since quitting: 7.2  . Smokeless tobacco: Never Used  . Tobacco comment: Socially  Substance Use Topics  . Alcohol use: Yes    Alcohol/week: 1.0 standard drinks    Types: 1 Standard drinks or equivalent per week    Comment: socially  . Drug use: No    Comment: hx.     Allergies   Patient has no known allergies.   Review of Systems Review of Systems   Physical Exam Triage Vital Signs ED Triage Vitals  Enc Vitals Group     BP 11/10/18 0858 127/80     Pulse Rate 11/10/18 0858 75     Resp 11/10/18 0858 18     Temp 11/10/18 0858 98.5 F (36.9 C)     Temp Source 11/10/18 0858 Oral     SpO2 11/10/18 0858 100 %     Weight --      Height --      Head Circumference --      Peak Flow --      Pain Score  11/10/18 0852 6     Pain Loc --      Pain Edu? --      Excl. in [place]? --    No data found.  Updated Vital Signs BP 127/80 (BP Location: Left Arm)   Pulse 75   Temp 98.5 F (36.9 C) (Oral)   Resp 18   SpO2 100%   Visual Acuity Right Eye Distance:   Left Eye Distance:   Bilateral Distance:    Right Eye Near:   Left Eye Near:    Bilateral Near:     Physical Exam Vitals signs and nursing note reviewed.  Constitutional:      General: She is not in acute distress.    Appearance: Normal appearance. She is not ill-appearing, toxic-appearing or diaphoretic.  HENT:     Head: Normocephalic and atraumatic.     Nose: Nose normal.  Eyes:     Conjunctiva/sclera: Conjunctivae normal.  Cardiovascular:     Rate and Rhythm: Normal rate and regular rhythm.     Pulses: Normal pulses.     Heart sounds: Normal heart sounds.  Pulmonary:     Effort: Pulmonary effort is normal.     Breath sounds: Normal breath sounds.  Musculoskeletal: Normal range of motion.       Arms:     Comments: TTP of left thoracic paravertebral muscles.  No swelling, bruising, erythema or deformities. No rash  Skin:    General: Skin is warm and dry.     Findings: No rash.  Neurological:     Mental Status: She is alert.  Psychiatric:        Mood and Affect: Mood normal.      UC Treatments / Results  Labs (all labs ordered are listed, but only abnormal results are displayed) Labs Reviewed  POCT URINALYSIS DIP (DEVICE)    EKG None  Radiology No results found.  Procedures Procedures (including critical care time)  Medications Ordered in UC Medications  ketorolac (TORADOL) 30 MG/ML injection 30 mg (30 mg Intramuscular Given 11/10/18 0946)  ketorolac (TORADOL) 30 MG/ML injection (has no administration in time range)    Initial Impression / Assessment and Plan /  UC Course  I have reviewed the triage vital signs and the nursing notes.  Pertinent labs & imaging results that were available during my  care of the patient were reviewed by me and considered in my medical decision making (see chart for details).     Urine negative for infection or trace blood. There is some tenderness to palpation of the left left thoracic paravertebral muscles. Based on no blood in urine there is a very small chance this could be a kidney stone. Not likely pyelonephritis VSS, she is nontoxic or ill-appearing Most likely diagnosis is muscle spasm/strain.  Toradol injection given here in clinic for pain and inflammation Sent home with Robaxin for muscle relaxant Recommended using heat and ice Also recommended if symptoms continue or worsen she will need to go to the ER for further evaluation and management. Pt understanding and agreed  Final Clinical Impressions(s) / UC Diagnoses   Final diagnoses:  Acute left-sided thoracic back pain     Discharge Instructions     Your urine did not show any signs of blood or infection. There is a very small chance that this could be a kidney stone. I am not seeing anything else concerning on your exam today. We will treat this as a muscle spasm/strain and gave you a muscle relaxant.  I am also giving you a shot of Toradol here for pain.  You can continue using the heat and ice. If your symptoms worsen or do not get better you will need to go to the hospital.    ED Prescriptions    Medication Sig Dispense Auth. Provider   methocarbamol (ROBAXIN) 500 MG tablet Take 1 tablet (500 mg total) by mouth 2 (two) times daily. 20 tablet Loura Halt A, NP     Controlled Substance Prescriptions [place] Controlled Substance Registry consulted? Not Applicable        Orvan July, NP 11/11/18 1728

## 2018-12-13 NOTE — Progress Notes (Addendum)
[facility] [address], [place], [place] [address] [phone]  Date:  12/15/2018   Name:  Wendy Tate   DOB:  1976-03-14   MRN:  [id number]  PCP:  Darreld Mclean, MD    Chief Complaint: Annual Exam (with pap)   History of Present Illness:  Wendy Tate is a 43 y.o. 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  This is much better now   We did a Pap for her last year which was normal- she likes to do annually due to history of LEEP Due for labs- she is fasting today  Mammogram completed in September 2019  She has been healthy over the last year Some changes at her work recently- she has been going into the office, she is now the long term care and loss center coordinator now  A couple of years ago she had concern of numbness in her hands.  I had her see Dr. Posey Pronto 2 years ago- they did eval mostly for CTS  She has noted some issues with numbness/ tingling now into her feet as well, difficulty with word finding  In 2018 she did nerve conduction studies and was dx with mild CTS- at that time her sx were just in her hands The other sx she notes today have been going on for about 7 months  Patient Active Problem List   Diagnosis Date Noted  . Bilateral carpal tunnel syndrome 12/30/2016  . Obesity 03/18/2016  . ASCUS (atypical squamous cells of undetermined significance) on Pap smear 02/16/2012  . Hx LEEP (loop electrosurgical excision procedure 02/16/2012    Past Medical History:  Diagnosis Date  . Abnormal Pap smear  04/2007, 04/2009, 07/2011   ASCUS   . Chicken pox   . Seasonal allergies     Past Surgical History:  Procedure Laterality Date  . ADENOIDECTOMY    . APPENDECTOMY    . DILATATION & CURETTAGE/HYSTEROSCOPY WITH TRUECLEAR N/A 01/18/2013   Procedure: DILATATION & CURETTAGE/HYSTEROSCOPY WITH TRUECLEAR;  Surgeon: Betsy Coder, MD;  Location: [facility];  Service: Gynecology;  Laterality: N/A;  D&C Hysteroscopy with TruClear  . LEEP    . TONSILLECTOMY    . WISDOM TOOTH EXTRACTION      Social History   Tobacco Use  . Smoking status: Light Tobacco Smoker    Last attempt to quit: 08/04/2011    Years since quitting: 7.3  . Smokeless tobacco: Never Used  . Tobacco comment: Socially  Substance Use Topics  . Alcohol use: Yes    Alcohol/week: 1.0 standard drinks    Types: 1 Standard drinks or equivalent per week    Comment: socially  . Drug use: No    Comment: hx.    Family History  Problem Relation Age of Onset  . Heart disease Mother   . Diabetes Mother   . Heart disease Father   . Leukemia Father        died at age 51  . Arthritis Maternal Grandmother   . Stroke Maternal Grandmother   .  Diabetes Maternal Grandmother     No Known Allergies  Medication list has been reviewed and updated.  Current Outpatient Medications on File Prior to Visit  Medication Sig Dispense Refill  . Black Pepper-Turmeric (TURMERIC CURCUMIN) 09-998 MG CAPS Take by mouth.    . Coenzyme Q10 (COQ-10 PO) Take by mouth.    . Ergocalciferol (VITAMIN D2) 50 MCG (2000 UT) TABS Take by mouth.    . Omega-3 Fatty Acids (FISH OIL) 1200 MG CAPS Take by mouth.    . SUPER B COMPLEX/C PO Take by mouth.     No current facility-administered medications on file prior to visit.     Review of Systems:  As per HPI- otherwise negative. No fever or chills No CP or SOB    Physical Examination: Vitals:   12/15/18 0844  BP: 116/82  Pulse: 85  Resp: 16  Temp: 98.2 F (36.8 C)  SpO2: 98%   Vitals:   12/15/18  0844  Weight: 200 lb (90.7 kg)  Height: 5' 4.5" (1.638 m)   Body mass index is 33.8 kg/m. Ideal Body Weight: Weight in (lb) to have BMI = 25: 147.6  GEN: WDWN, NAD, Non-toxic, A & O x 3, overweight, looks well  HEENT: Atraumatic, Normocephalic. Neck supple. No masses, No LAD. Ears and Nose: No external deformity. CV: RRR, No M/G/R. No JVD. No thrill. No extra heart sounds. PULM: CTA B, no wheezes, crackles, rhonchi. No retractions. No resp. distress. No accessory muscle use. ABD: S, NT, ND, +BS. No rebound. No HSM. EXTR: No c/c/e NEURO Normal gait.  PSYCH: Normally interactive. Conversant. Not depressed or anxious appearing.  Calm demeanor.  Breast: normal exam, no masses/ dimpling/ discharge Pelvic: normal, no vaginal lesions or discharge. Uterus normal, no CMT, no adnexal tendereness or masses Normal strength, sensation and DTR of all limbs, normal romberg and tandem stance    Assessment and Plan:   ICD-10-CM   1. Physical exam  Z00.00   2. Screening for deficiency anemia  Z13.0 CBC  3. Screening for diabetes mellitus  Z13.1 Comprehensive metabolic panel    Hemoglobin A1c  4. Screening for breast cancer  Z12.39 MM 3D SCREEN BREAST BILATERAL  5. Screening for HIV (human immunodeficiency virus)  Z11.4 HIV Antibody (routine testing w rflx)  6. Screening for hyperlipidemia  Z13.220 Lipid panel  7. Vitamin D deficiency  E55.9 Vitamin D (25 hydroxy)  8. Paresthesias  R20.2 TSH    B12 and Folate Panel  9. Word finding difficulty  R47.89   10. Screening for cervical cancer  Z12.4 Cytology - PAP   Following up for a CPE today Pap and labs pending as above Will ask Dr. Posey Pronto about follow-up for her neurologic concerns  Will plan further follow- up pending labs.   Follow-up: No follow-ups on file.  No orders of the defined types were placed in this encounter.  Orders Placed This Encounter  Procedures  . MM 3D SCREEN BREAST BILATERAL  . CBC  . Comprehensive metabolic panel   . Hemoglobin A1c  . Lipid panel  . HIV Antibody (routine testing w rflx)  . Vitamin D (25 hydroxy)  . TSH  . B12 and Folate Panel    @SIGN @    Signed Lamar Blinks, MD  Received her labs, just waiting on Pap  Message to patient  Results for orders placed or performed in visit on 12/15/18  CBC  Result Value Ref Range   WBC 6.9 4.0 - 10.5 K/uL   RBC  4.52 3.87 - 5.11 Mil/uL   Platelets 238.0 150.0 - 400.0 K/uL   Hemoglobin 14.1 12.0 - 15.0 g/dL   HCT 42.3 36.0 - 46.0 %   MCV 93.5 78.0 - 100.0 fl   MCHC 33.4 30.0 - 36.0 g/dL   RDW 12.4 11.5 - 15.5 %  Comprehensive metabolic panel  Result Value Ref Range   Sodium 140 135 - 145 mEq/L   Potassium 4.3 3.5 - 5.1 mEq/L   Chloride 104 96 - 112 mEq/L   CO2 28 19 - 32 mEq/L   Glucose, Bld 87 70 - 99 mg/dL   BUN 9 6 - 23 mg/dL   Creatinine, Ser 0.72 0.40 - 1.20 mg/dL   Total Bilirubin 0.5 0.2 - 1.2 mg/dL   Alkaline Phosphatase 67 39 - 117 U/L   AST 15 0 - 37 U/L   ALT 14 0 - 35 U/L   Total Protein 6.7 6.0 - 8.3 g/dL   Albumin 4.6 3.5 - 5.2 g/dL   Calcium 9.6 8.4 - 10.5 mg/dL   GFR 87.86 >60.00 mL/min  Hemoglobin A1c  Result Value Ref Range   Hgb A1c MFr Bld 5.8 4.6 - 6.5 %  Lipid panel  Result Value Ref Range   Cholesterol 228 (H) 0 - 200 mg/dL   Triglycerides 127.0 0.0 - 149.0 mg/dL   HDL 41.60 >39.00 mg/dL   VLDL 25.4 0.0 - 40.0 mg/dL   LDL Cholesterol 161 (H) 0 - 99 mg/dL   Total CHOL/HDL Ratio 5    NonHDL 186.04   Vitamin D (25 hydroxy)  Result Value Ref Range   VITD 33.23 30.00 - 100.00 ng/mL  TSH  Result Value Ref Range   TSH 1.93 0.35 - 4.50 uIU/mL  B12 and Folate Panel  Result Value Ref Range   Vitamin B-12 302 211 - 911 pg/mL   Folate 16.2 >5.9 ng/mL

## 2018-12-15 ENCOUNTER — Encounter: Payer: Self-pay | Admitting: Family Medicine

## 2018-12-15 ENCOUNTER — Other Ambulatory Visit: Payer: Self-pay

## 2018-12-15 ENCOUNTER — Ambulatory Visit (INDEPENDENT_AMBULATORY_CARE_PROVIDER_SITE_OTHER): Payer: 59 | Admitting: Family Medicine

## 2018-12-15 ENCOUNTER — Other Ambulatory Visit (HOSPITAL_COMMUNITY)
Admission: RE | Admit: 2018-12-15 | Discharge: 2018-12-15 | Disposition: A | Discharge status: Home / Self Care | Payer: 59 | Source: Ambulatory Visit | Attending: Family Medicine | Admitting: Family Medicine

## 2018-12-15 VITALS — BP 116/82 | HR 85 | Temp 98.2°F | Resp 16 | Ht 64.5 in | Wt 200.0 lb

## 2018-12-15 DIAGNOSIS — R4789 Other speech disturbances: Secondary | ICD-10-CM

## 2018-12-15 DIAGNOSIS — Z124 Encounter for screening for malignant neoplasm of cervix: Secondary | ICD-10-CM

## 2018-12-15 DIAGNOSIS — Z131 Encounter for screening for diabetes mellitus: Secondary | ICD-10-CM

## 2018-12-15 DIAGNOSIS — Z1239 Encounter for other screening for malignant neoplasm of breast: Secondary | ICD-10-CM

## 2018-12-15 DIAGNOSIS — E559 Vitamin D deficiency, unspecified: Secondary | ICD-10-CM | POA: Diagnosis not present

## 2018-12-15 DIAGNOSIS — Z0000 Encounter for general adult medical examination without abnormal findings: Secondary | ICD-10-CM | POA: Diagnosis not present

## 2018-12-15 DIAGNOSIS — Z130 Encounter for screening for diseases of the blood and blood-forming organs and certain disorders involving the immune mechanism: Secondary | ICD-10-CM | POA: Diagnosis not present

## 2018-12-15 DIAGNOSIS — Z13220 Encounter for screening for lipoid disorders: Secondary | ICD-10-CM

## 2018-12-15 DIAGNOSIS — Z114 Encounter for screening for human immunodeficiency virus [HIV]: Secondary | ICD-10-CM

## 2018-12-15 DIAGNOSIS — R202 Paresthesia of skin: Secondary | ICD-10-CM

## 2018-12-15 DIAGNOSIS — R69 Illness, unspecified: Secondary | ICD-10-CM | POA: Diagnosis not present

## 2018-12-15 LAB — VITAMIN D 25 HYDROXY (VIT D DEFICIENCY, FRACTURES): VITD: 33.23 ng/mL (ref 30.00–100.00)

## 2018-12-15 LAB — CBC
HCT: 42.3 % (ref 36.0–46.0)
Hemoglobin: 14.1 g/dL (ref 12.0–15.0)
MCHC: 33.4 g/dL (ref 30.0–36.0)
MCV: 93.5 fl (ref 78.0–100.0)
Platelets: 238 10*3/uL (ref 150.0–400.0)
RBC: 4.52 Mil/uL (ref 3.87–5.11)
RDW: 12.4 % (ref 11.5–15.5)
WBC: 6.9 10*3/uL (ref 4.0–10.5)

## 2018-12-15 LAB — COMPREHENSIVE METABOLIC PANEL
ALT: 14 U/L (ref 0–35)
AST: 15 U/L (ref 0–37)
Albumin: 4.6 g/dL (ref 3.5–5.2)
Alkaline Phosphatase: 67 U/L (ref 39–117)
BUN: 9 mg/dL (ref 6–23)
CO2: 28 mEq/L (ref 19–32)
Calcium: 9.6 mg/dL (ref 8.4–10.5)
Chloride: 104 mEq/L (ref 96–112)
Creatinine, Ser: 0.72 mg/dL (ref 0.40–1.20)
GFR: 87.86 mL/min (ref >60.00)
Glucose, Bld: 87 mg/dL (ref 70–99)
Potassium: 4.3 mEq/L (ref 3.5–5.1)
Sodium: 140 mEq/L (ref 135–145)
Total Bilirubin: 0.5 mg/dL (ref 0.2–1.2)
Total Protein: 6.7 g/dL (ref 6.0–8.3)

## 2018-12-15 LAB — TSH: TSH: 1.93 u[IU]/mL (ref 0.35–4.50)

## 2018-12-15 LAB — B12 AND FOLATE PANEL
Folate: 16.2 ng/mL (ref >5.9)
Vitamin B-12: 302 pg/mL (ref 211–911)

## 2018-12-15 LAB — LIPID PANEL
Cholesterol: 228 mg/dL — ABNORMAL HIGH (ref 0–200)
HDL: 41.6 mg/dL (ref >39.00)
LDL Cholesterol: 161 mg/dL — ABNORMAL HIGH (ref 0–99)
NonHDL: 186.04
Total CHOL/HDL Ratio: 5
Triglycerides: 127 mg/dL (ref 0.0–149.0)
VLDL: 25.4 mg/dL (ref 0.0–40.0)

## 2018-12-15 LAB — HEMOGLOBIN A1C: Hgb A1c MFr Bld: 5.8 % (ref 4.6–6.5)

## 2018-12-15 NOTE — Patient Instructions (Addendum)
It was great to see you today- I will be in touch with your labs asap Please have your mammogram this September as usual- I ordered this for you today  I will touch base with Dr Posey Pronto and ask her to see you for a follow-up visit to discuss some of your other concerns  Take care!   Health Maintenance, Female Adopting a healthy lifestyle and getting preventive care are important in promoting health and wellness. Ask your health care provider about:  The right schedule for you to have regular tests and exams.  Things you can do on your own to prevent diseases and keep yourself healthy. What should I know about diet, weight, and exercise? Eat a healthy diet   Eat a diet that includes plenty of vegetables, fruits, low-fat dairy products, and lean protein.  Do not eat a lot of foods that are high in solid fats, added sugars, or sodium. Maintain a healthy weight Body mass index (BMI) is used to identify weight problems. It estimates body fat based on height and weight. Your health care provider can help determine your BMI and help you achieve or maintain a healthy weight. Get regular exercise Get regular exercise. This is one of the most important things you can do for your health. Most adults should:  Exercise for at least 150 minutes each week. The exercise should increase your heart rate and make you sweat (moderate-intensity exercise).  Do strengthening exercises at least twice a week. This is in addition to the moderate-intensity exercise.  Spend less time sitting. Even light physical activity can be beneficial. Watch cholesterol and blood lipids Have your blood tested for lipids and cholesterol at 44 years of age, then have this test every 5 years. Have your cholesterol levels checked more often if:  Your lipid or cholesterol levels are high.  You are older than 44 years of age.  You are at high risk for heart disease. What should I know about cancer screening? Depending on  your health history and family history, you may need to have cancer screening at various ages. This may include screening for:  Breast cancer.  Cervical cancer.  Colorectal cancer.  Skin cancer.  Lung cancer. What should I know about heart disease, diabetes, and high blood pressure? Blood pressure and heart disease  High blood pressure causes heart disease and increases the risk of stroke. This is more likely to develop in people who have high blood pressure readings, are of African descent, or are overweight.  Have your blood pressure checked: ? Every 3-5 years if you are 47-77 years of age. ? Every year if you are 63 years old or older. Diabetes Have regular diabetes screenings. This checks your fasting blood sugar level. Have the screening done:  Once every three years after age 61 if you are at a normal weight and have a low risk for diabetes.  More often and at a younger age if you are overweight or have a high risk for diabetes. What should I know about preventing infection? Hepatitis B If you have a higher risk for hepatitis B, you should be screened for this virus. Talk with your health care provider to find out if you are at risk for hepatitis B infection. Hepatitis C Testing is recommended for:  Everyone born from [birth year] through 1966.  Anyone with known risk factors for hepatitis C. Sexually transmitted infections (STIs)  Get screened for STIs, including gonorrhea and chlamydia, if: ? You are sexually active and  are younger than 44 years of age. ? You are older than 44 years of age and your health care provider tells you that you are at risk for this type of infection. ? Your sexual activity has changed since you were last screened, and you are at increased risk for chlamydia or gonorrhea. Ask your health care provider if you are at risk.  Ask your health care provider about whether you are at high risk for HIV. Your health care provider may recommend a prescription  medicine to help prevent HIV infection. If you choose to take medicine to prevent HIV, you should first get tested for HIV. You should then be tested every 3 months for as long as you are taking the medicine. Pregnancy  If you are about to stop having your period (premenopausal) and you may become pregnant, seek counseling before you get pregnant.  Take 400 to 800 micrograms (mcg) of folic acid every day if you become pregnant.  Ask for birth control (contraception) if you want to prevent pregnancy. Osteoporosis and menopause Osteoporosis is a disease in which the bones lose minerals and strength with aging. This can result in bone fractures. If you are 47 years old or older, or if you are at risk for osteoporosis and fractures, ask your health care provider if you should:  Be screened for bone loss.  Take a calcium or vitamin D supplement to lower your risk of fractures.  Be given hormone replacement therapy (HRT) to treat symptoms of menopause. Follow these instructions at home: Lifestyle  Do not use any products that contain nicotine or tobacco, such as cigarettes, e-cigarettes, and chewing tobacco. If you need help quitting, ask your health care provider.  Do not use street drugs.  Do not share needles.  Ask your health care provider for help if you need support or information about quitting drugs. Alcohol use  Do not drink alcohol if: ? Your health care provider tells you not to drink. ? You are pregnant, may be pregnant, or are planning to become pregnant.  If you drink alcohol: ? Limit how much you use to 0-1 drink a day. ? Limit intake if you are breastfeeding.  Be aware of how much alcohol is in your drink. In the [place] one drink equals one 12 oz bottle of beer (355 mL), one 5 oz glass of wine (148 mL), or one 1 oz glass of hard liquor (44 mL). General instructions  Schedule regular health, dental, and eye exams.  Stay current with your vaccines.  Tell your health  care provider if: ? You often feel depressed. ? You have ever been abused or do not feel safe at home. Summary  Adopting a healthy lifestyle and getting preventive care are important in promoting health and wellness.  Follow your health care provider's instructions about healthy diet, exercising, and getting tested or screened for diseases.  Follow your health care provider's instructions on monitoring your cholesterol and blood pressure. This information is not intended to replace advice given to you by your health care provider. Make sure you discuss any questions you have with your health care provider. Document Released: 11/17/2010 Document Revised: 04/27/2018 Document Reviewed: 04/27/2018 Elsevier Patient Education  2020 [employer].

## 2018-12-16 LAB — HIV ANTIBODY (ROUTINE TESTING W REFLEX): HIV 1&2 Ab, 4th Generation: NONREACTIVE

## 2018-12-18 LAB — CYTOLOGY - PAP
Adequacy: ABSENT
Diagnosis: NEGATIVE
HPV: NOT DETECTED

## 2018-12-19 ENCOUNTER — Encounter: Payer: Self-pay | Admitting: Family Medicine

## 2018-12-30 ENCOUNTER — Other Ambulatory Visit: Payer: Self-pay

## 2018-12-30 ENCOUNTER — Telehealth (INDEPENDENT_AMBULATORY_CARE_PROVIDER_SITE_OTHER): Payer: 59 | Admitting: Neurology

## 2018-12-30 ENCOUNTER — Encounter: Payer: Self-pay | Admitting: Neurology

## 2018-12-30 VITALS — Ht 64.0 in | Wt 198.0 lb

## 2018-12-30 DIAGNOSIS — R4789 Other speech disturbances: Secondary | ICD-10-CM | POA: Diagnosis not present

## 2018-12-30 DIAGNOSIS — R2689 Other abnormalities of gait and mobility: Secondary | ICD-10-CM | POA: Diagnosis not present

## 2018-12-30 DIAGNOSIS — R202 Paresthesia of skin: Secondary | ICD-10-CM | POA: Diagnosis not present

## 2018-12-30 NOTE — Progress Notes (Signed)
Virtual Visit via Video Note The purpose of this virtual visit is to provide medical care while limiting exposure to the novel coronavirus.    Consent was obtained for video visit:  Yes.   Answered questions that patient had about telehealth interaction:  Yes.   I discussed the limitations, risks, security and privacy concerns of performing an evaluation and management service by telemedicine. I also discussed with the patient that there may be a patient responsible charge related to this service. The patient expressed understanding and agreed to proceed.  Pt location: Home Physician Location: office Name of referring provider:  Copland, Gay Filler, MD I connected with Judith Part at patients initiation/request on 12/30/2018 at  1:30 PM EDT by video enabled telemedicine application and verified that I am speaking with the correct person using two identifiers. Pt MRN:  [id number] Pt DOB:  09-Jan-1976 Video Participants:  Judith Part   History of Present Illness: This is a 43 y.o. female returning to the office with new complaints of numbness and tingling of the legs, imbalance, and word finding difficulties.  She was last seen in 2018 for bilateral hand paresthesias and found to have very mild carpal tunnel syndrome.  She was having atypical and intermittent abdominal paresthesias, which was nonspecific.  Since this time, she has had numbness in the toes and feet.  Over the past several months, the numbness has extended into her lower legs and thighs.  There is no specific pattern on the leg and can involve the anterior and posterior aspect of the legs.  She does not have weakness.  She does complain of unsteadiness, and tends to bump into the walls more often.  She walks unassisted and has not had any falls.  She has achy low back pain for quite some time.   Labs including vitamin B12, folate, and TSH is normal.  She also complains of word finding difficulties.  No problems with memory or doing  day-to-day activities.  Observations/Objective:   Vitals:   12/30/18 0813  Weight: 198 lb (89.8 kg)  Height: 5' 4"  (1.626 m)   Montreal Cognitive Assessment  12/29/2018  Visuospatial/ Executive (0/5) 5  Naming (0/3) 3  Attention: Read list of digits (0/2) 2  Attention: Read list of letters (0/1) 1  Attention: Serial 7 subtraction starting at 100 (0/3) 3  Language: Repeat phrase (0/2) 2  Language : Fluency (0/1) 1  Abstraction (0/2) 2  Delayed Recall (0/5) 4  Orientation (0/6) 6  Total 29  Adjusted Score (based on education) 29    Patient is awake, alert, and appears comfortable.  Oriented x 4.   Extraocular muscles are intact. No ptosis.  Face is symmetric.  Speech is not dysarthric. Tongue is midline. Antigravity in all extremities.  No pronator drift. Gait appears normal   DATA: NCS/EMG of the arms 12/30/2016: Bilateral median neuropathy at or distal to the wrist, consistent with clinical diagnosis of carpal tunnel syndrome; mild in degree electrically on the left and very mild on the right.   Assessment and Plan:  1.  Diffuse and intermittent bilateral leg paresthesias, which does not conform to a dermatomal cutaneous nerve distribution.  Symptoms are not typical for lumbar radiculopathy.  She will undergo electrodiagnostic testing of the legs, however I explained that the nature of her symptoms is too widespread and and atypical for primary nerve pathology.  2.  Word finding difficulty.  In isolation, it is unlikely to represent anything worrisome.  She scored extremely well  on her cognitive testing.  TSH and vitamin B12 is normal.  Should symptoms persist, formal neurocognitive testing is the next step.  3.  Gait unsteadiness.  She will return to the office for formal neuromuscular exam to better assess and guide management.   Follow Up Instructions:   I discussed the assessment and treatment plan with the patient. The patient was provided an opportunity to ask questions  and all were answered. The patient agreed with the plan and demonstrated an understanding of the instructions.   The patient was advised to call back or seek an in-person evaluation if the symptoms worsen or if the condition fails to improve as anticipated.  Total time spent:  30 minutes     Alda Berthold, DO

## 2019-01-31 ENCOUNTER — Ambulatory Visit (HOSPITAL_BASED_OUTPATIENT_CLINIC_OR_DEPARTMENT_OTHER)
Admission: RE | Admit: 2019-01-31 | Discharge: 2019-01-31 | Disposition: A | Discharge status: Home / Self Care | Payer: 59 | Source: Ambulatory Visit | Attending: Family Medicine | Admitting: Family Medicine

## 2019-01-31 ENCOUNTER — Encounter (HOSPITAL_BASED_OUTPATIENT_CLINIC_OR_DEPARTMENT_OTHER): Payer: Self-pay

## 2019-01-31 ENCOUNTER — Other Ambulatory Visit: Payer: Self-pay

## 2019-01-31 DIAGNOSIS — Z1231 Encounter for screening mammogram for malignant neoplasm of breast: Secondary | ICD-10-CM | POA: Insufficient documentation

## 2019-01-31 DIAGNOSIS — Z1239 Encounter for other screening for malignant neoplasm of breast: Secondary | ICD-10-CM

## 2019-02-01 ENCOUNTER — Other Ambulatory Visit: Payer: Self-pay | Admitting: Family Medicine

## 2019-02-01 DIAGNOSIS — R928 Other abnormal and inconclusive findings on diagnostic imaging of breast: Secondary | ICD-10-CM

## 2019-02-06 ENCOUNTER — Ambulatory Visit: Payer: 59

## 2019-02-06 ENCOUNTER — Other Ambulatory Visit: Payer: Self-pay

## 2019-02-06 ENCOUNTER — Ambulatory Visit
Admission: RE | Admit: 2019-02-06 | Discharge: 2019-02-06 | Disposition: A | Discharge status: Home / Self Care | Payer: 59 | Source: Ambulatory Visit | Attending: Family Medicine | Admitting: Family Medicine

## 2019-02-06 DIAGNOSIS — R928 Other abnormal and inconclusive findings on diagnostic imaging of breast: Secondary | ICD-10-CM | POA: Diagnosis not present

## 2019-02-07 ENCOUNTER — Encounter: Payer: 59 | Admitting: Neurology

## 2019-02-07 ENCOUNTER — Ambulatory Visit: Payer: 59 | Admitting: Neurology

## 2019-06-12 ENCOUNTER — Encounter: Payer: Self-pay | Admitting: Family Medicine

## 2019-06-13 ENCOUNTER — Ambulatory Visit: Payer: 59 | Attending: Internal Medicine

## 2019-06-13 DIAGNOSIS — Z20822 Contact with and (suspected) exposure to covid-19: Secondary | ICD-10-CM

## 2019-06-14 LAB — NOVEL CORONAVIRUS, NAA: SARS-CoV-2, NAA: NOT DETECTED

## 2019-07-27 ENCOUNTER — Ambulatory Visit: Payer: 59 | Attending: Internal Medicine

## 2019-07-27 DIAGNOSIS — Z23 Encounter for immunization: Secondary | ICD-10-CM

## 2019-07-27 NOTE — Progress Notes (Signed)
   Covid-19 Vaccination Clinic  Name:  Wendy Tate    MRN: [id number] DOB: 01/18/1976  07/27/2019  [name] was observed post Covid-19 immunization for 15 minutes without incident. She was provided with Vaccine Information Sheet and instruction to access the V-Safe system.   [name] was instructed to call 911 with any severe reactions post vaccine: Marland Kitchen Difficulty breathing  . Swelling of face and throat  . A fast heartbeat  . A bad rash all over body  . Dizziness and weakness   Immunizations Administered    Name Date Dose VIS Date Route   Pfizer COVID-19 Vaccine 07/27/2019  3:09 PM 0.3 mL 04/28/2019 Intramuscular   Manufacturer: [place]   Lot: [id number]   [place]: [id number]

## 2019-08-23 ENCOUNTER — Ambulatory Visit: Payer: 59 | Attending: Internal Medicine

## 2019-08-23 DIAGNOSIS — Z23 Encounter for immunization: Secondary | ICD-10-CM

## 2019-08-23 NOTE — Progress Notes (Signed)
   Covid-19 Vaccination Clinic  Name:  Makena Murdock    MRN: [id number] DOB: April 14, 1976  08/23/2019  Ms. Dower was observed post Covid-19 immunization for 15 minutes without incident. She was provided with Vaccine Information Sheet and instruction to access the V-Safe system.   Ms. Lantigua was instructed to call 911 with any severe reactions post vaccine: Marland Kitchen Difficulty breathing  . Swelling of face and throat  . A fast heartbeat  . A bad rash all over body  . Dizziness and weakness   Immunizations Administered    Name Date Dose VIS Date Route   Pfizer COVID-19 Vaccine 08/23/2019  2:51 PM 0.3 mL 04/28/2019 Intramuscular   Manufacturer: [place]   Lot: [id number]   [place]: [id number]

## 2019-12-21 ENCOUNTER — Encounter: Payer: 59 | Admitting: Family Medicine

## 2020-01-02 DIAGNOSIS — R7303 Prediabetes: Secondary | ICD-10-CM | POA: Insufficient documentation

## 2020-01-02 NOTE — Patient Instructions (Addendum)
It was great to see you again today!   I will be in touch with your labs, pap asap We will set you up to see GYN and also for PT for your back  We will order a cologuard kit for you.  Take care!    Health Maintenance, Female Adopting a healthy lifestyle and getting preventive care are important in promoting health and wellness. Ask your health care provider about:  The right schedule for you to have regular tests and exams.  Things you can do on your own to prevent diseases and keep yourself healthy. What should I know about diet, weight, and exercise? Eat a healthy diet   Eat a diet that includes plenty of vegetables, fruits, low-fat dairy products, and lean protein.  Do not eat a lot of foods that are high in solid fats, added sugars, or sodium. Maintain a healthy weight Body mass index (BMI) is used to identify weight problems. It estimates body fat based on height and weight. Your health care provider can help determine your BMI and help you achieve or maintain a healthy weight. Get regular exercise Get regular exercise. This is one of the most important things you can do for your health. Most adults should:  Exercise for at least 150 minutes each week. The exercise should increase your heart rate and make you sweat (moderate-intensity exercise).  Do strengthening exercises at least twice a week. This is in addition to the moderate-intensity exercise.  Spend less time sitting. Even light physical activity can be beneficial. Watch cholesterol and blood lipids Have your blood tested for lipids and cholesterol at 45 years of age, then have this test every 5 years. Have your cholesterol levels checked more often if:  Your lipid or cholesterol levels are high.  You are older than 45 years of age.  You are at high risk for heart disease. What should I know about cancer screening? Depending on your health history and family history, you may need to have cancer screening at various  ages. This may include screening for:  Breast cancer.  Cervical cancer.  Colorectal cancer.  Skin cancer.  Lung cancer. What should I know about heart disease, diabetes, and high blood pressure? Blood pressure and heart disease  High blood pressure causes heart disease and increases the risk of stroke. This is more likely to develop in people who have high blood pressure readings, are of African descent, or are overweight.  Have your blood pressure checked: ? Every 3-5 years if you are 66-42 years of age. ? Every year if you are 59 years old or older. Diabetes Have regular diabetes screenings. This checks your fasting blood sugar level. Have the screening done:  Once every three years after age 25 if you are at a normal weight and have a low risk for diabetes.  More often and at a younger age if you are overweight or have a high risk for diabetes. What should I know about preventing infection? Hepatitis B If you have a higher risk for hepatitis B, you should be screened for this virus. Talk with your health care provider to find out if you are at risk for hepatitis B infection. Hepatitis C Testing is recommended for:  Everyone born from [birth year] through 1966.  Anyone with known risk factors for hepatitis C. Sexually transmitted infections (STIs)  Get screened for STIs, including gonorrhea and chlamydia, if: ? You are sexually active and are younger than 45 years of age. ? You are  older than 45 years of age and your health care provider tells you that you are at risk for this type of infection. ? Your sexual activity has changed since you were last screened, and you are at increased risk for chlamydia or gonorrhea. Ask your health care provider if you are at risk.  Ask your health care provider about whether you are at high risk for HIV. Your health care provider may recommend a prescription medicine to help prevent HIV infection. If you choose to take medicine to prevent HIV, you  should first get tested for HIV. You should then be tested every 3 months for as long as you are taking the medicine. Pregnancy  If you are about to stop having your period (premenopausal) and you may become pregnant, seek counseling before you get pregnant.  Take 400 to 800 micrograms (mcg) of folic acid every day if you become pregnant.  Ask for birth control (contraception) if you want to prevent pregnancy. Osteoporosis and menopause Osteoporosis is a disease in which the bones lose minerals and strength with aging. This can result in bone fractures. If you are 8 years old or older, or if you are at risk for osteoporosis and fractures, ask your health care provider if you should:  Be screened for bone loss.  Take a calcium or vitamin D supplement to lower your risk of fractures.  Be given hormone replacement therapy (HRT) to treat symptoms of menopause. Follow these instructions at home: Lifestyle  Do not use any products that contain nicotine or tobacco, such as cigarettes, e-cigarettes, and chewing tobacco. If you need help quitting, ask your health care provider.  Do not use street drugs.  Do not share needles.  Ask your health care provider for help if you need support or information about quitting drugs. Alcohol use  Do not drink alcohol if: ? Your health care provider tells you not to drink. ? You are pregnant, may be pregnant, or are planning to become pregnant.  If you drink alcohol: ? Limit how much you use to 0-1 drink a day. ? Limit intake if you are breastfeeding.  Be aware of how much alcohol is in your drink. In the [place] one drink equals one 12 oz bottle of beer (355 mL), one 5 oz glass of wine (148 mL), or one 1 oz glass of hard liquor (44 mL). General instructions  Schedule regular health, dental, and eye exams.  Stay current with your vaccines.  Tell your health care provider if: ? You often feel depressed. ? You have ever been abused or do not feel  safe at home. Summary  Adopting a healthy lifestyle and getting preventive care are important in promoting health and wellness.  Follow your health care provider's instructions about healthy diet, exercising, and getting tested or screened for diseases.  Follow your health care provider's instructions on monitoring your cholesterol and blood pressure. This information is not intended to replace advice given to you by your health care provider. Make sure you discuss any questions you have with your health care provider. Document Revised: 04/27/2018 Document Reviewed: 04/27/2018 Elsevier Patient Education  2020 [employer].

## 2020-01-02 NOTE — Progress Notes (Addendum)
[facility] [address], [place], [place] [address] [phone]  Date:  01/03/2020   Name:  Wendy Tate   DOB:  [birth year]   MRN:  [id number]  PCP:  Darreld Mclean, MD    Chief Complaint: Annual Exam   History of Present Illness:  Seirra Kos is a 44 y.o. 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 Posey Pronto Assessment and Plan:  1.  Diffuse and intermittent bilateral leg paresthesias, which does not conform to a dermatomal cutaneous nerve distribution.  Symptoms are not typical for lumbar radiculopathy.  She will undergo electrodiagnostic testing of the legs, however I explained that the nature of her symptoms is too widespread and and atypical for primary nerve pathology. 2.  Word finding difficulty.  In isolation, it is unlikely to represent anything worrisome.  She scored extremely well on her cognitive testing.  TSH and vitamin B12 is normal.  Should symptoms persist, formal neurocognitive testing is the next step. 3.  Gait unsteadiness.  She will return to the office for formal neuromuscular exam to better assess and guide management  She would like to do some PT for her back pain and for leg weakness- will arrange for her  Hepatitis C screening, routine labs are due- she is fasting  Most recent Pap 1 year ago-normal with negative HPV COVID-19 series done Colon cancer screening- no family  history, no sx.  Will order a cologuard for her  Mammogram-due this month  She is having longer and heavier menses for the last 9 months or so The bleeding is getting the way of her activities   Admits she is not getting a lot of exercise lately Former smoker    Patient Active Problem List   Diagnosis Date Noted  . Prediabetes 01/02/2020  . Bilateral carpal tunnel syndrome 12/30/2016  . Obesity 03/18/2016  . ASCUS (atypical squamous cells of undetermined significance) on Pap smear 02/16/2012  . Hx LEEP (loop electrosurgical excision procedure 02/16/2012    Past Medical History:  Diagnosis Date  . Abnormal Pap smear 04/2007, 04/2009, 07/2011   ASCUS   . Chicken pox   . Seasonal allergies     Past Surgical History:  Procedure Laterality Date  . ADENOIDECTOMY    . APPENDECTOMY    . DILATATION & CURETTAGE/HYSTEROSCOPY WITH TRUECLEAR N/A 01/18/2013   Procedure: DILATATION & CURETTAGE/HYSTEROSCOPY WITH TRUECLEAR;  Surgeon: Betsy Coder, MD;  Location: [facility];  Service: Gynecology;  Laterality: N/A;  D&C Hysteroscopy with TruClear  . LEEP    . TONSILLECTOMY    . WISDOM TOOTH EXTRACTION      Social History   Tobacco Use  . Smoking status: Light Tobacco Smoker    Last attempt to quit: 08/04/2011    Years since quitting: 8.4  . Smokeless tobacco: Never Used  . Tobacco comment: Socially  Vaping Use  . Vaping Use: Never used  Substance Use  Topics  . Alcohol use: Yes    Alcohol/week: 1.0 standard drink    Types: 1 Standard drinks or equivalent per week    Comment: socially  . Drug use: No    Comment: hx.    Family History  Problem Relation Age of Onset  . Heart disease Mother   . Diabetes Mother   . Heart disease Father   . Leukemia Father        died at age 45  . Arthritis Maternal Grandmother   . Stroke Maternal Grandmother   . Diabetes Maternal Grandmother     No Known Allergies  Medication list has been reviewed and updated.  Current Outpatient  Medications on File Prior to Visit  Medication Sig Dispense Refill  . Biotin w/ Vitamins C & E (HAIR/SKIN/NAILS PO) Take by mouth.    . Cholecalciferol (VITAMIN D3 PO) Take 2,000 Int'l Units by mouth daily.    . SUPER B COMPLEX/C PO Take by mouth.    . TURMERIC PO Take 1,500 mg by mouth daily.    Marland Kitchen UNABLE TO FIND Take 1,400 mg by mouth daily. Med Name: Fish Oil     No current facility-administered medications on file prior to visit.    Review of Systems:  As per HPI- otherwise negative.   Physical Examination: Vitals:   01/03/20 1109  BP: 112/72  Pulse: 77  Resp: 16  SpO2: 98%   Vitals:   01/03/20 1109  Weight: 205 lb (93 kg)  Height: 5' 4"  (1.626 m)   Body mass index is 35.19 kg/m. Ideal Body Weight: Weight in (lb) to have BMI = 25: 145.3  GEN: no acute distress.  Obese, looks well  HEENT: Atraumatic, Normocephalic.  Bilateral TM wnl, oropharynx normal.  PEERL,EOMI.   Ears and Nose: No external deformity. CV: RRR, No M/G/R. No JVD. No thrill. No extra heart sounds. PULM: CTA B, no wheezes, crackles, rhonchi. No retractions. No resp. distress. No accessory muscle use. ABD: S, NT, ND, +BS. No rebound. No HSM. EXTR: No c/c/e PSYCH: Normally interactive. Conversant.  Breast: normal exam, no masses/ dimpling/ discharge Pelvic: normal, no vaginal lesions or discharge. Uterus normal, no CMT, no adnexal tendereness or masses.  S/p LEEP     Assessment and Plan: Physical exam  Prediabetes - Plan: Hemoglobin A1c  Screening for deficiency anemia - Plan: CBC  Screening for diabetes mellitus - Plan: Comprehensive metabolic panel, Hemoglobin A1c  Screening for hyperlipidemia - Plan: Lipid panel  Encounter for hepatitis C screening test for low risk patient - Plan: Hepatitis C antibody  Screening for thyroid disorder - Plan: TSH  Screening for cervical cancer - Plan: Cytology - PAP  Menorrhagia with regular cycle - Plan: Ambulatory referral to Obstetrics /  Gynecology  Chronic midline low back pain without sciatica - Plan: Ambulatory referral to Physical Therapy, methocarbamol (ROBAXIN) 500 MG tablet  CPE today Referral to OBG for menorrhagia/ ablation might help? Refilled her robaxin and referred to PT for her back Will plan further follow- up pending labs. Encouraged exercise and healthy diet  This visit occurred during the SARS-CoV-2 public health emergency.  Safety protocols were in place, including screening questions prior to the visit, additional usage of staff PPE, and extensive cleaning of exam room while observing appropriate contact time as indicated for disinfecting solutions.    Signed Lamar Blinks, MD  Received her labs as below- message to pt   Blood counts normal Metabolic profile normal H8N is in the normal range (  average blood sugar) Cholesterol overall ok- work on exercise to help lower your LDL Thyroid in normal range  Overall good news! JC  Results for orders placed or performed in visit on 01/03/20  CBC  Result Value Ref Range   WBC 5.6 4.0 - 10.5 K/uL   RBC 4.32 3.87 - 5.11 Mil/uL   Platelets 282.0 150 - 400 K/uL   Hemoglobin 13.5 12.0 - 15.0 g/dL   HCT 40.3 36 - 46 %   MCV 93.3 78.0 - 100.0 fl   MCHC 33.5 30.0 - 36.0 g/dL   RDW 13.2 11.5 - 15.5 %  Comprehensive metabolic panel  Result Value Ref Range   Sodium 140 135 - 145 mEq/L   Potassium 4.1 3.5 - 5.1 mEq/L   Chloride 103 96 - 112 mEq/L   CO2 30 19 - 32 mEq/L   Glucose, Bld 87 70 - 99 mg/dL   BUN 9 6 - 23 mg/dL   Creatinine, Ser 0.81 0.40 - 1.20 mg/dL   Total Bilirubin 0.5 0.2 - 1.2 mg/dL   Alkaline Phosphatase 62 39 - 117 U/L   AST 31 0 - 37 U/L   ALT 35 0 - 35 U/L   Total Protein 6.8 6.0 - 8.3 g/dL   Albumin 4.5 3.5 - 5.2 g/dL   GFR 76.33 >60.00 mL/min   Calcium 9.5 8.4 - 10.5 mg/dL  Hemoglobin A1c  Result Value Ref Range   Hgb A1c MFr Bld 5.6 4.6 - 6.5 %  Lipid panel  Result Value Ref Range   Cholesterol 226 (H) 0 - 200 mg/dL    Triglycerides 139.0 0 - 149 mg/dL   HDL 42.20 >39.00 mg/dL   VLDL 27.8 0.0 - 40.0 mg/dL   LDL Cholesterol 156 (H) 0 - 99 mg/dL   Total CHOL/HDL Ratio 5    NonHDL 184.22   TSH  Result Value Ref Range   TSH 2.40 0.35 - 4.50 uIU/mL   Message to pt  The 10-year ASCVD risk score Mikey Bussing DC Jr., et al., 2013) is: 4%   Values used to calculate the score:     Age: 68 years     Sex: Female     Is Non-Hispanic African American: No     Diabetic: No     Tobacco smoker: Yes     Systolic Blood Pressure: 035 mmHg     Is BP treated: No     HDL Cholesterol: 42.2 mg/dL     Total Cholesterol: 226 mg/dL

## 2020-01-03 ENCOUNTER — Other Ambulatory Visit (HOSPITAL_BASED_OUTPATIENT_CLINIC_OR_DEPARTMENT_OTHER): Payer: Self-pay | Admitting: Family Medicine

## 2020-01-03 ENCOUNTER — Ambulatory Visit (INDEPENDENT_AMBULATORY_CARE_PROVIDER_SITE_OTHER): Payer: 59 | Admitting: Family Medicine

## 2020-01-03 ENCOUNTER — Other Ambulatory Visit (HOSPITAL_COMMUNITY)
Admission: RE | Admit: 2020-01-03 | Discharge: 2020-01-03 | Disposition: A | Discharge status: Home / Self Care | Payer: 59 | Source: Ambulatory Visit | Attending: Family Medicine | Admitting: Family Medicine

## 2020-01-03 ENCOUNTER — Encounter: Payer: Self-pay | Admitting: Family Medicine

## 2020-01-03 ENCOUNTER — Other Ambulatory Visit: Payer: Self-pay

## 2020-01-03 VITALS — BP 112/72 | HR 77 | Resp 16 | Ht 64.0 in | Wt 205.0 lb

## 2020-01-03 DIAGNOSIS — Z124 Encounter for screening for malignant neoplasm of cervix: Secondary | ICD-10-CM | POA: Diagnosis not present

## 2020-01-03 DIAGNOSIS — Z131 Encounter for screening for diabetes mellitus: Secondary | ICD-10-CM

## 2020-01-03 DIAGNOSIS — Z0000 Encounter for general adult medical examination without abnormal findings: Secondary | ICD-10-CM

## 2020-01-03 DIAGNOSIS — R7303 Prediabetes: Secondary | ICD-10-CM

## 2020-01-03 DIAGNOSIS — G8929 Other chronic pain: Secondary | ICD-10-CM

## 2020-01-03 DIAGNOSIS — Z13220 Encounter for screening for lipoid disorders: Secondary | ICD-10-CM

## 2020-01-03 DIAGNOSIS — Z130 Encounter for screening for diseases of the blood and blood-forming organs and certain disorders involving the immune mechanism: Secondary | ICD-10-CM

## 2020-01-03 DIAGNOSIS — Z1329 Encounter for screening for other suspected endocrine disorder: Secondary | ICD-10-CM | POA: Diagnosis not present

## 2020-01-03 DIAGNOSIS — M545 Low back pain: Secondary | ICD-10-CM

## 2020-01-03 DIAGNOSIS — Z1159 Encounter for screening for other viral diseases: Secondary | ICD-10-CM

## 2020-01-03 DIAGNOSIS — N920 Excessive and frequent menstruation with regular cycle: Secondary | ICD-10-CM | POA: Diagnosis not present

## 2020-01-03 DIAGNOSIS — Z1231 Encounter for screening mammogram for malignant neoplasm of breast: Secondary | ICD-10-CM

## 2020-01-03 LAB — COMPREHENSIVE METABOLIC PANEL
ALT: 35 U/L (ref 0–35)
AST: 31 U/L (ref 0–37)
Albumin: 4.5 g/dL (ref 3.5–5.2)
Alkaline Phosphatase: 62 U/L (ref 39–117)
BUN: 9 mg/dL (ref 6–23)
CO2: 30 mEq/L (ref 19–32)
Calcium: 9.5 mg/dL (ref 8.4–10.5)
Chloride: 103 mEq/L (ref 96–112)
Creatinine, Ser: 0.81 mg/dL (ref 0.40–1.20)
GFR: 76.33 mL/min (ref >60.00)
Glucose, Bld: 87 mg/dL (ref 70–99)
Potassium: 4.1 mEq/L (ref 3.5–5.1)
Sodium: 140 mEq/L (ref 135–145)
Total Bilirubin: 0.5 mg/dL (ref 0.2–1.2)
Total Protein: 6.8 g/dL (ref 6.0–8.3)

## 2020-01-03 LAB — LIPID PANEL
Cholesterol: 226 mg/dL — ABNORMAL HIGH (ref 0–200)
HDL: 42.2 mg/dL (ref >39.00)
LDL Cholesterol: 156 mg/dL — ABNORMAL HIGH (ref 0–99)
NonHDL: 184.22
Total CHOL/HDL Ratio: 5
Triglycerides: 139 mg/dL (ref 0.0–149.0)
VLDL: 27.8 mg/dL (ref 0.0–40.0)

## 2020-01-03 LAB — CBC
HCT: 40.3 % (ref 36.0–46.0)
Hemoglobin: 13.5 g/dL (ref 12.0–15.0)
MCHC: 33.5 g/dL (ref 30.0–36.0)
MCV: 93.3 fl (ref 78.0–100.0)
Platelets: 282 10*3/uL (ref 150.0–400.0)
RBC: 4.32 Mil/uL (ref 3.87–5.11)
RDW: 13.2 % (ref 11.5–15.5)
WBC: 5.6 10*3/uL (ref 4.0–10.5)

## 2020-01-03 LAB — TSH: TSH: 2.4 u[IU]/mL (ref 0.35–4.50)

## 2020-01-03 LAB — HEMOGLOBIN A1C: Hgb A1c MFr Bld: 5.6 % (ref 4.6–6.5)

## 2020-01-03 MED ORDER — METHOCARBAMOL 500 MG PO TABS: 500.0000 mg | ORAL_TABLET | Freq: Three times a day (TID) | ORAL | 0 refills | Status: DC | PRN

## 2020-01-03 NOTE — Progress Notes (Signed)
Cologuard ordered for patient.

## 2020-01-04 ENCOUNTER — Encounter: Payer: Self-pay | Admitting: Family Medicine

## 2020-01-04 LAB — CYTOLOGY - PAP
Comment: NEGATIVE
Diagnosis: NEGATIVE
High risk HPV: NEGATIVE

## 2020-01-04 LAB — HEPATITIS C ANTIBODY
Hepatitis C Ab: NONREACTIVE
SIGNAL TO CUT-OFF: 0.01 (ref <1.00)

## 2020-01-10 LAB — COLOGUARD

## 2020-01-10 LAB — EXTERNAL GENERIC LAB PROCEDURE

## 2020-02-04 DIAGNOSIS — Z1211 Encounter for screening for malignant neoplasm of colon: Secondary | ICD-10-CM | POA: Diagnosis not present

## 2020-02-04 LAB — COLOGUARD: Cologuard: NEGATIVE

## 2020-02-07 ENCOUNTER — Ambulatory Visit (HOSPITAL_BASED_OUTPATIENT_CLINIC_OR_DEPARTMENT_OTHER)
Admission: RE | Admit: 2020-02-07 | Discharge: 2020-02-07 | Disposition: A | Discharge status: Home / Self Care | Payer: 59 | Source: Ambulatory Visit | Attending: Family Medicine | Admitting: Family Medicine

## 2020-02-07 ENCOUNTER — Other Ambulatory Visit: Payer: Self-pay

## 2020-02-07 DIAGNOSIS — Z1231 Encounter for screening mammogram for malignant neoplasm of breast: Secondary | ICD-10-CM | POA: Diagnosis not present

## 2020-02-08 ENCOUNTER — Encounter: Payer: Self-pay | Admitting: Family Medicine

## 2020-02-08 ENCOUNTER — Other Ambulatory Visit: Payer: Self-pay | Admitting: Family Medicine

## 2020-02-08 DIAGNOSIS — R928 Other abnormal and inconclusive findings on diagnostic imaging of breast: Secondary | ICD-10-CM

## 2020-02-08 LAB — COLOGUARD: COLOGUARD: NEGATIVE

## 2020-02-08 LAB — EXTERNAL GENERIC LAB PROCEDURE: COLOGUARD: NEGATIVE

## 2020-02-12 ENCOUNTER — Encounter: Payer: Self-pay | Admitting: Family Medicine

## 2020-02-13 ENCOUNTER — Ambulatory Visit: Payer: 59 | Attending: Family Medicine | Admitting: Physical Therapy

## 2020-02-13 ENCOUNTER — Encounter: Payer: Self-pay | Admitting: Family Medicine

## 2020-02-13 ENCOUNTER — Other Ambulatory Visit: Payer: Self-pay

## 2020-02-13 ENCOUNTER — Encounter: Payer: Self-pay | Admitting: Physical Therapy

## 2020-02-13 DIAGNOSIS — R293 Abnormal posture: Secondary | ICD-10-CM | POA: Insufficient documentation

## 2020-02-13 DIAGNOSIS — M545 Low back pain, unspecified: Secondary | ICD-10-CM

## 2020-02-13 DIAGNOSIS — G8929 Other chronic pain: Secondary | ICD-10-CM | POA: Diagnosis not present

## 2020-02-13 DIAGNOSIS — M6281 Muscle weakness (generalized): Secondary | ICD-10-CM | POA: Insufficient documentation

## 2020-02-13 NOTE — Patient Instructions (Signed)
Access Code: [email or web address] URL: [email or web address] Date: 02/13/2020 Prepared by: Venetia Night Tausha Milhoan  Exercises Sidelying Transversus Abdominis Bracing - 1 x daily - 7 x weekly - 1 sets - 10 reps - 5 hold Beginner Clam - 1 x daily - 7 x weekly - 2 sets - 10 reps Sidelying Hip Abduction - 1 x daily - 7 x weekly - 1 sets - 10 reps

## 2020-02-13 NOTE — Therapy (Signed)
[facility] [address], [place], [place], [address] Phone: [phone]   Fax:  [phone]  Physical Therapy Evaluation  Patient Details  Name: Wendy Tate MRN: [id number] Date of Birth: 23-Aug-1975 Referring Provider (PT): Copland, Gay Filler, MD   Encounter Date: 02/13/2020   PT End of Session - 02/13/20 1524    Visit Number 1    Date for PT Re-Evaluation 05/07/20    Authorization Type [employer]et[employer]    PT Start Time 1400    PT Stop Time 1445    PT Time Calculation (min) 45 min    Activity Tolerance Patient tolerated treatment well    Behavior During Therapy [facility] for tasks assessed/performed           Past Medical History:  Diagnosis Date  . Abnormal Pap smear 04/2007, 04/2009, 07/2011   ASCUS   . Chicken pox   . Seasonal allergies     Past Surgical History:  Procedure Laterality Date  . ADENOIDECTOMY    . APPENDECTOMY    . DILATATION & CURETTAGE/HYSTEROSCOPY WITH TRUECLEAR N/A 01/18/2013   Procedure: DILATATION & CURETTAGE/HYSTEROSCOPY WITH TRUECLEAR;  Surgeon: Betsy Coder, MD;  Location: [facility];  Service: Gynecology;  Laterality: N/A;  D&C Hysteroscopy with TruClear  . LEEP    . TONSILLECTOMY    . WISDOM TOOTH EXTRACTION      There were no vitals filed for this visit.    Subjective Assessment - 02/13/20 1359    Subjective Pt referred to OPPT with LBP without sciatica.  Pain is chronic x years but Pt has had increase in spikes of back pain.  Pain is worse after hours of yard and house work.    Limitations Lifting;House hold activities;Other (comment)   yard work   Patient Stated Goals get rid of pain    Currently in Pain? Yes    Pain Score 5    gets up to 8/10 with yard and house work   Pain Location Back    Pain Orientation Lower;Left;Right    Pain Descriptors / Indicators Aching;Dull;Spasm    Pain Type Chronic pain;Acute pain    Pain Radiating Towards upwards in back and into hips    Pain Onset More  than a month ago    Pain Frequency Constant    Aggravating Factors  bend/lift for house and yard work    Pain Relieving Factors on stomach with Rt leg out              [facility] PT Assessment - 02/13/20 0001      Assessment   Medical Diagnosis M54.5,G89.29 (ICD-10-CM) - Chronic midline low back pain without sciatica    Referring Provider (PT) Copland, Gay Filler, MD    Onset Date/Surgical Date --   years ago   Hand Dominance Right    Next MD Visit no    Prior Therapy no      Precautions   Precautions None      Restrictions   Weight Bearing Restrictions No      Balance Screen   Has the patient fallen in the past 6 months No      [place] residence    Living Arrangements Spouse/significant other    [place] to enter    [place] One level      Prior Function   Level of Independence Independent    Vocation Full time employment    Engineer, manufacturing work  Leisure garden, yardwork      Cognition   Overall Cognitive Status Within Functional Limits for tasks assessed      Observation/Other Assessments   Focus on Therapeutic Outcomes (FOTO)  39% goal 31%      Sensation   Light Touch Appears Intact      Functional Tests   Functional tests Single leg stance      Single Leg Stance   Comments Rt SI unlocks, able to balance but more challenging on Rt LE      Posture/Postural Control   Posture/Postural Control Postural limitations    Postural Limitations Increased lumbar lordosis;Forward head      ROM / Strength   AROM / PROM / Strength AROM;PROM;Strength      AROM   Overall AROM Comments limited trunk bil SB contralateral stretch, lumbar flexion full with central lumbar pain on flexion and return to stand, extension full with pain    AROM Assessment Site Lumbar    Lumbar Flexion fingers to toes, central LBP and pain on return to stand    Lumbar Extension 10 deg with pain, poor fascial approximation    Lumbar  - Right Side Bend 10 deg contralateral stretch    Lumbar - Left Side Bend 10 deg contralateral stretch    Lumbar - Right Rotation limited 50%    Lumbar - Left Rotation limited 50%      PROM   Overall PROM Comments limited by neural tension 20%, Rt hip IR 5 deg, limited with Lt LBP on assessment    PROM Assessment Site Hip    Right/Left Hip Right    Right Hip Extension 15    Right Hip Flexion 65   neural tension   Right Hip Internal Rotation  0   firm end feel and back pain   Left Hip Extension 15    Left Hip Internal Rotation  10   no pain     Strength   Strength Assessment Site Knee;Hip    Right/Left Hip Right    Right Hip External Rotation  4/5    Right Hip ABduction 4/5    Right/Left Knee Right    Right Knee Flexion 4/5    Right Knee Extension 5/5      Flexibility   Soft Tissue Assessment /Muscle Length yes    Hamstrings limited on Rt 30 deg, neural tension present      Palpation   Spinal mobility limited segemental flexion L4/5, L5/S1,     SI assessment  WNL    Palpation comment Rt multifidi and lumbar paraspinals, Rt glut med, limited skin rolling central lumbar region, myofascial limitations thoracodorsal fascia bil      Special Tests    Special Tests Lumbar;Laxity/Instability Tests    Laxity/Instability  other    Lumbar Tests Straight Leg Raise;other2      Other   Findings Positive    side --   bil   comment lumbar rotation stress test at L5 in prone      Straight Leg Raise   Findings Positive    Side  Right    Comment neural tension hip to foot at 65 deg      other   Comment relief with prone manual distraction of sacrum from L5      Ambulation/Gait   Gait Comments mild Rt out-toeing reported by Pt, new                      Objective  measurements completed on examination: See above findings.                 PT Short Term Goals - 02/13/20 1519      PT SHORT TERM GOAL #1   Title Pt will be ind with initial HEP    Status New     Target Date 03/12/20      PT SHORT TERM GOAL #2   Title Pt will learn and demo proper hip hinge squat with core recruitment for improved body mechanics for daily tasks around home and in yard.    Time 4    Period Weeks    Status New    Target Date 03/12/20             PT Long Term Goals - 02/13/20 1520      PT LONG TERM GOAL #1   Title Pt will be ind with advanced HEP    Time 12    Period Weeks    Status New    Target Date 05/07/20      PT LONG TERM GOAL #2   Title Pt will achieve Rt hip ROM WNL for improved mobility for gardening.    Time 12    Period Weeks    Status New    Target Date 05/07/20      PT LONG TERM GOAL #3   Title Pt will be able to tolerate up to 2 hours of house or yardwork with minimal pain.    Time 12    Period Weeks    Status New    Target Date 05/07/20      PT LONG TERM GOAL #4   Title Pt will achieve Rt hip and hamstring strength to at least 4+/5 for improved stability for dynamic squatting and rising from squat.    Time 12    Period Weeks    Status New    Target Date 05/07/20      PT LONG TERM GOAL #5   Title Pt will achieve full trunk ROM with min pain for improved tolerance of household and yard tasks.    Time 12    Period Weeks    Status New    Target Date 05/07/20      Additional Long Term Goals   Additional Long Term Goals Yes      PT LONG TERM GOAL #6   Title Reduce FOTO to </= 31% from 39% to demo less limitation.    Time 12    Period Weeks    Status New    Target Date 05/07/20                  Plan - 02/13/20 1506    Clinical Impression Statement Pt is a pleasant 44yo female who has experienced LBP for years with recent increase in occurrence of spikes of pain.  Pain is located in central low back and spreads upwards into thoracic region.  Pain is described as achey, dull and cramping and is worse following hours of gardening, yardwork and housework.  "I can be down for days after that."  Pt presents with  increased lumbar lordosis with thoracodorsal fascial restrictions and segmental flexion restrictions in L4-S1.  She has some atrophy of Rt lumbar multifidus with + rotational torsion test bil at L5.  She has slight weakness in Rt LE in hamstring, hip abd and ER.  Neural tension is present at 65 deg on Rt from hip to foot.  Rt  hip IR is 0 deg with firm end feel and LBP.  She has some trouble stabilizing for balance in Rt SLS with slight unlocking of Rt SI joint.  Pt will benefit from skilled PT to address posture, stabilization needs, body mechanics training and mobility/flexibility restrictions to reduce pain and improved tolerance of daily tasks.    Personal Factors and Comorbidities Time since onset of injury/illness/exacerbation    Examination-Activity Limitations Lift;Carry;Bend;Squat    Examination-Participation Restrictions Yard Work;Laundry;Cleaning;Community Activity    Stability/Clinical Decision Making Stable/Uncomplicated    Clinical Decision Making Low    Rehab Potential Excellent    PT Frequency 1x / week   Pt needs 1x/week   PT Duration 12 weeks    PT Treatment/Interventions ADLs/Self Care Home Management;Cryotherapy;Electrical Stimulation;Moist Heat;Traction;Neuromuscular re-education;Balance training;Therapeutic exercise;Therapeutic activities;Functional mobility training;Patient/family education;Manual techniques;Dry needling;Passive range of motion;Taping;Joint Manipulations;Spinal Manipulations    PT Next Visit Plan f/u on HEP, prone skin rolling / myofascial release thoracodorsal fascia, sacral distraction from L5 prone, Rt sciatic nerve glides, hip mobility Rt IR, squat to chair with core to initiate body mechanics for bend    PT Home Exercise Plan Access Code: [email or web address]    Consulted and Agree with Plan of Care Patient           Patient will benefit from skilled therapeutic intervention in order to improve the following deficits and impairments:  Decreased range of motion,  Increased fascial restricitons, Increased muscle spasms, Pain, Improper body mechanics, Impaired flexibility, Hypomobility, Decreased strength, Postural dysfunction  Visit Diagnosis: Chronic midline low back pain without sciatica - Plan: PT plan of care cert/re-cert  Muscle weakness (generalized) - Plan: PT plan of care cert/re-cert  Abnormal posture - Plan: PT plan of care cert/re-cert     Problem List Patient Active Problem List   Diagnosis Date Noted  . Prediabetes 01/02/2020  . Bilateral carpal tunnel syndrome 12/30/2016  . Obesity 03/18/2016  . ASCUS (atypical squamous cells of undetermined significance) on Pap smear 02/16/2012  . Hx LEEP (loop electrosurgical excision procedure 02/16/2012    Baruch Merl, PT 02/13/20 3:26 PM   [facility] [address] [place], [place], [address] Phone: [phone]   Fax:  [phone]  Name: Wendy Tate MRN: [id number] Date of Birth: [birth year]

## 2020-02-16 ENCOUNTER — Other Ambulatory Visit: Payer: Self-pay | Admitting: Family Medicine

## 2020-02-16 ENCOUNTER — Ambulatory Visit
Admission: RE | Admit: 2020-02-16 | Discharge: 2020-02-16 | Disposition: A | Discharge status: Home / Self Care | Payer: 59 | Source: Ambulatory Visit | Attending: Family Medicine | Admitting: Family Medicine

## 2020-02-16 ENCOUNTER — Other Ambulatory Visit: Payer: Self-pay

## 2020-02-16 DIAGNOSIS — R928 Other abnormal and inconclusive findings on diagnostic imaging of breast: Secondary | ICD-10-CM

## 2020-02-16 DIAGNOSIS — N6489 Other specified disorders of breast: Secondary | ICD-10-CM | POA: Diagnosis not present

## 2020-02-21 ENCOUNTER — Ambulatory Visit: Payer: 59 | Attending: Family Medicine | Admitting: Physical Therapy

## 2020-02-21 ENCOUNTER — Encounter: Payer: Self-pay | Admitting: Physical Therapy

## 2020-02-21 ENCOUNTER — Other Ambulatory Visit: Payer: Self-pay

## 2020-02-21 DIAGNOSIS — M545 Low back pain, unspecified: Secondary | ICD-10-CM | POA: Insufficient documentation

## 2020-02-21 DIAGNOSIS — M6281 Muscle weakness (generalized): Secondary | ICD-10-CM

## 2020-02-21 DIAGNOSIS — R293 Abnormal posture: Secondary | ICD-10-CM | POA: Diagnosis not present

## 2020-02-21 DIAGNOSIS — G8929 Other chronic pain: Secondary | ICD-10-CM | POA: Diagnosis not present

## 2020-02-21 NOTE — Therapy (Addendum)
[facility] [address] [place], [place], [address] Phone: [phone]   Fax:  [phone]  Physical Therapy Treatment  Patient Details  Name: Wendy Tate MRN: [id number] Date of Birth: 08-16-1975 Referring Provider (PT): Copland, Gay Filler, MD   Encounter Date: 02/21/2020   PT End of Session - 02/21/20 1700    Visit Number 2    Date for PT Re-Evaluation 05/07/20    Authorization Type [employer]et[employer]    PT Start Time 3500    PT Stop Time 1655    PT Time Calculation (min) 41 min    Activity Tolerance Patient tolerated treatment well;No increased pain    Behavior During Therapy WFL for tasks assessed/performed           Past Medical History:  Diagnosis Date  . Abnormal Pap smear 04/2007, 04/2009, 07/2011   ASCUS   . Chicken pox   . Seasonal allergies     Past Surgical History:  Procedure Laterality Date  . ADENOIDECTOMY    . APPENDECTOMY    . DILATATION & CURETTAGE/HYSTEROSCOPY WITH TRUECLEAR N/A 01/18/2013   Procedure: DILATATION & CURETTAGE/HYSTEROSCOPY WITH TRUECLEAR;  Surgeon: Betsy Coder, MD;  Location: [facility];  Service: Gynecology;  Laterality: N/A;  D&C Hysteroscopy with TruClear  . LEEP    . TONSILLECTOMY    . WISDOM TOOTH EXTRACTION      There were no vitals filed for this visit.   Subjective Assessment - 02/21/20 1619    Subjective No changes since the eval. Sucking in the abdomen in helps her to manage the lumbar pain.    Limitations Lifting;House hold activities;Other (comment)   yardwork   Patient Stated Goals get rid of pain    Currently in Pain? Yes    Pain Score 5     Pain Location Back    Pain Orientation Right;Left;Lower    Pain Descriptors / Indicators Aching;Dull;Spasm    Pain Type Acute pain;Chronic pain    Pain Onset More than a month ago    Pain Frequency Constant    Aggravating Factors  bend/lift for house and yardwork    Pain Relieving Factors on stomach with right leg out     Multiple Pain Sites No                             [facility] Adult PT Treatment/Exercise - 02/21/20 0001      Lumbar Exercises: Stretches   Hip Flexor Stretch Right;Left;1 rep;30 seconds    Hip Flexor Stretch Limitations with strap prone    Press Ups 10 reps    ITB Stretch Right;Left;1 rep;30 seconds    ITB Stretch Limitations strap in supine    Other Lumbar Stretch Exercise hip adductor supine with strap right, left  30 sec      Lumbar Exercises: Aerobic   Nustep level 1 for 5 min while assessing patient; seat # 7, arm #10      Lumbar Exercises: Sidelying   Clam Right;Left;10 reps    Clam Limitations engaging TA correctly    Other Sidelying Lumbar Exercises sidely TA contraction holding 5 sec 10x      Manual Therapy   Manual Therapy Joint mobilization;Soft tissue mobilization;Myofascial release    Manual therapy comments to assess for dry needling    Joint Mobilization P-A and rotational mobilization to T8-L5    Soft tissue mobilization thoracolumbar junction and along the iliac crest  Myofascial Release using a suction cup to release the fascia between the skin and muscle along the lumbar            Trigger Point Dry Needling - 02/21/20 0001    Consent Given? Yes    Education Handout Provided Yes    Muscles Treated Back/Hip Lumbar multifidi    Lumbar multifidi Response Twitch response elicited;Palpable increased muscle length                PT Education - 02/21/20 1654    Education Details Access Code: [email or web address]; information on dry needling    Person(s) Educated Patient    Methods Explanation;Demonstration;Verbal cues    Comprehension Verbalized understanding;Returned demonstration            PT Short Term Goals - 02/13/20 1519      PT SHORT TERM GOAL #1   Title Pt will be ind with initial HEP    Status New    Target Date 03/12/20      PT SHORT TERM GOAL #2   Title Pt will learn and demo proper hip hinge squat with core recruitment  for improved body mechanics for daily tasks around home and in yard.    Time 4    Period Weeks    Status New    Target Date 03/12/20             PT Long Term Goals - 02/13/20 1520      PT LONG TERM GOAL #1   Title Pt will be ind with advanced HEP    Time 12    Period Weeks    Status New    Target Date 05/07/20      PT LONG TERM GOAL #2   Title Pt will achieve Rt hip ROM WNL for improved mobility for gardening.    Time 12    Period Weeks    Status New    Target Date 05/07/20      PT LONG TERM GOAL #3   Title Pt will be able to tolerate up to 2 hours of house or yardwork with minimal pain.    Time 12    Period Weeks    Status New    Target Date 05/07/20      PT LONG TERM GOAL #4   Title Pt will achieve Rt hip and hamstring strength to at least 4+/5 for improved stability for dynamic squatting and rising from squat.    Time 12    Period Weeks    Status New    Target Date 05/07/20      PT LONG TERM GOAL #5   Title Pt will achieve full trunk ROM with min pain for improved tolerance of household and yard tasks.    Time 12    Period Weeks    Status New    Target Date 05/07/20      Additional Long Term Goals   Additional Long Term Goals Yes      PT LONG TERM GOAL #6   Title Reduce FOTO to </= 31% from 39% to demo less limitation.    Time 12    Period Weeks    Status New    Target Date 05/07/20                 Plan - 02/21/20 1700    Clinical Impression Statement Patient felt less pain after therapy. Her HEP included more stretches. She continues to have fascial tightness in the lumbar region  and lower intercoastals. Patient is doing the posterior pelvic tilt to reduce her low back during the day. Patient had trigger points in the lumbar multifidi. Patient will benefit from skilled therapy to address posture, stabilization needs, body mechanics training and mobility/flexibility restrictions to reduce pain and improved tolerance of daily tasks.    Personal  Factors and Comorbidities Time since onset of injury/illness/exacerbation    Examination-Activity Limitations Lift;Carry;Bend;Squat    Examination-Participation Restrictions Yard Work;Laundry;Cleaning;Community Activity    Stability/Clinical Decision Making Stable/Uncomplicated    Rehab Potential Excellent    PT Frequency 1x / week    PT Duration 12 weeks    PT Treatment/Interventions ADLs/Self Care Home Management;Cryotherapy;Electrical Stimulation;Moist Heat;Traction;Neuromuscular re-education;Balance training;Therapeutic exercise;Therapeutic activities;Functional mobility training;Patient/family education;Manual techniques;Dry needling;Passive range of motion;Taping;Joint Manipulations;Spinal Manipulations    PT Next Visit Plan see how dry needling went; f/u on HEP, prone skin rolling / myofascial release thoracodorsal fascia, sacral distraction from L5 prone, Rt sciatic nerve glides, hip mobility Rt IR, squat to chair with core to initiate body mechanics for bend    PT Home Exercise Plan Access Code: [email or web address]    Recommended Other Services MD signed initial note    Consulted and Agree with Plan of Care Patient           Patient will benefit from skilled therapeutic intervention in order to improve the following deficits and impairments:  Decreased range of motion, Increased fascial restricitons, Increased muscle spasms, Pain, Improper body mechanics, Impaired flexibility, Hypomobility, Decreased strength, Postural dysfunction  Visit Diagnosis: Chronic midline low back pain without sciatica  Muscle weakness (generalized)  Abnormal posture     Problem List Patient Active Problem List   Diagnosis Date Noted  . Prediabetes 01/02/2020  . Bilateral carpal tunnel syndrome 12/30/2016  . Obesity 03/18/2016  . ASCUS (atypical squamous cells of undetermined significance) on Pap smear 02/16/2012  . Hx LEEP (loop electrosurgical excision procedure 02/16/2012    Earlie Counts, PT 02/21/20  5:05 PM  PHYSICAL THERAPY DISCHARGE SUMMARY  Visits from Start of Care: 2  Current functional level related to goals / functional outcomes: Pt called to cancel appointments due to another health condition.   Remaining deficits: See above   Education / Equipment: Initial HEP Plan: Patient agrees to discharge.  Patient goals were not met. Patient is being discharged due to the patient's request.  ?????         Baruch Merl, PT 03/05/20 8:43 AM   [facility] [address] [place], [place], [address] Phone: [phone]   Fax:  [phone]  Name: Wendy Tate MRN: [id number] Date of Birth: 06-24-1975

## 2020-02-21 NOTE — Patient Instructions (Addendum)
Access Code: [email or web address] URL: [email or web address] Date: 02/21/2020 Prepared by: Earlie Counts  Exercises Sidelying Transversus Abdominis Bracing - 1 x daily - 7 x weekly - 1 sets - 10 reps - 5 hold Beginner Clam - 1 x daily - 7 x weekly - 2 sets - 10 reps Sidelying Hip Abduction - 1 x daily - 7 x weekly - 1 sets - 10 reps Supine Hamstring Stretch with Strap - 1 x daily - 7 x weekly - 3 sets - 10 reps Hip Adductors and Hamstring Stretch with Strap - 1 x daily - 7 x weekly - 1 sets - 2 reps - 30 sec hold Supine ITB Stretch with Strap - 1 x daily - 7 x weekly - 1 sets - 2 reps - 30 sec hold Supine Double Knee to Chest - 1 x daily - 7 x weekly - 1 sets - 1 reps - 30 sec hold Patient Education Trigger [facility] Needling  [facility] [address] [place], [place] [address] Phone # [phone] Fax [phone]

## 2020-02-27 ENCOUNTER — Ambulatory Visit
Admission: RE | Admit: 2020-02-27 | Discharge: 2020-02-27 | Disposition: A | Discharge status: Home / Self Care | Payer: 59 | Source: Ambulatory Visit | Attending: Family Medicine | Admitting: Family Medicine

## 2020-02-27 ENCOUNTER — Other Ambulatory Visit (HOSPITAL_COMMUNITY)
Admission: RE | Admit: 2020-02-27 | Discharge: 2020-02-27 | Disposition: A | Discharge status: Home / Self Care | Payer: 59 | Source: Ambulatory Visit | Attending: Diagnostic Radiology | Admitting: Diagnostic Radiology

## 2020-02-27 ENCOUNTER — Other Ambulatory Visit: Payer: Self-pay

## 2020-02-27 ENCOUNTER — Emergency Department (HOSPITAL_BASED_OUTPATIENT_CLINIC_OR_DEPARTMENT_OTHER)
Admission: EM | Admit: 2020-02-27 | Discharge: 2020-02-28 | Disposition: A | Discharge status: Home / Self Care | Payer: 59 | Attending: Emergency Medicine | Admitting: Emergency Medicine

## 2020-02-27 ENCOUNTER — Encounter (HOSPITAL_BASED_OUTPATIENT_CLINIC_OR_DEPARTMENT_OTHER): Payer: Self-pay | Admitting: Emergency Medicine

## 2020-02-27 DIAGNOSIS — R928 Other abnormal and inconclusive findings on diagnostic imaging of breast: Secondary | ICD-10-CM

## 2020-02-27 DIAGNOSIS — T782XXA Anaphylactic shock, unspecified, initial encounter: Secondary | ICD-10-CM | POA: Insufficient documentation

## 2020-02-27 DIAGNOSIS — N6320 Unspecified lump in the left breast, unspecified quadrant: Secondary | ICD-10-CM | POA: Diagnosis not present

## 2020-02-27 DIAGNOSIS — I10 Essential (primary) hypertension: Secondary | ICD-10-CM | POA: Diagnosis not present

## 2020-02-27 DIAGNOSIS — T7840XA Allergy, unspecified, initial encounter: Secondary | ICD-10-CM | POA: Diagnosis not present

## 2020-02-27 DIAGNOSIS — Z20822 Contact with and (suspected) exposure to covid-19: Secondary | ICD-10-CM | POA: Diagnosis not present

## 2020-02-27 DIAGNOSIS — J189 Pneumonia, unspecified organism: Secondary | ICD-10-CM

## 2020-02-27 DIAGNOSIS — R590 Localized enlarged lymph nodes: Secondary | ICD-10-CM | POA: Diagnosis not present

## 2020-02-27 DIAGNOSIS — F17200 Nicotine dependence, unspecified, uncomplicated: Secondary | ICD-10-CM | POA: Insufficient documentation

## 2020-02-27 DIAGNOSIS — R000 Tachycardia, unspecified: Secondary | ICD-10-CM | POA: Diagnosis not present

## 2020-02-27 DIAGNOSIS — N6322 Unspecified lump in the left breast, upper inner quadrant: Secondary | ICD-10-CM | POA: Diagnosis not present

## 2020-02-27 LAB — RESPIRATORY PANEL BY RT PCR (FLU A&B, COVID)
Influenza A by PCR: NEGATIVE
Influenza B by PCR: NEGATIVE
SARS Coronavirus 2 by RT PCR: NEGATIVE

## 2020-02-27 LAB — BASIC METABOLIC PANEL
Anion gap: 11 (ref 5–15)
BUN: 12 mg/dL (ref 6–20)
CO2: 23 mmol/L (ref 22–32)
Calcium: 9.7 mg/dL (ref 8.9–10.3)
Chloride: 106 mmol/L (ref 98–111)
Creatinine, Ser: 0.78 mg/dL (ref 0.44–1.00)
GFR, Estimated: >60 mL/min (ref >60)
Glucose, Bld: 164 mg/dL — ABNORMAL HIGH (ref 70–99)
Potassium: 3.7 mmol/L (ref 3.5–5.1)
Sodium: 140 mmol/L (ref 135–145)

## 2020-02-27 LAB — CBC WITH DIFFERENTIAL/PLATELET
Abs Immature Granulocytes: 0.19 10*3/uL — ABNORMAL HIGH (ref 0.00–0.07)
Basophils Absolute: 0.1 10*3/uL (ref 0.0–0.1)
Basophils Relative: 0 %
Eosinophils Absolute: 0 10*3/uL (ref 0.0–0.5)
Eosinophils Relative: 0 %
HCT: 49.3 % — ABNORMAL HIGH (ref 36.0–46.0)
Hemoglobin: 16.7 g/dL — ABNORMAL HIGH (ref 12.0–15.0)
Immature Granulocytes: 1 %
Lymphocytes Relative: 6 %
Lymphs Abs: 1.7 10*3/uL (ref 0.7–4.0)
MCH: 31 pg (ref 26.0–34.0)
MCHC: 33.9 g/dL (ref 30.0–36.0)
MCV: 91.5 fL (ref 80.0–100.0)
Monocytes Absolute: 0.7 10*3/uL (ref 0.1–1.0)
Monocytes Relative: 3 %
Neutro Abs: 23.6 10*3/uL — ABNORMAL HIGH (ref 1.7–7.7)
Neutrophils Relative %: 90 %
Platelets: 400 10*3/uL (ref 150–400)
RBC: 5.39 MIL/uL — ABNORMAL HIGH (ref 3.87–5.11)
RDW: 12.7 % (ref 11.5–15.5)
Smear Review: NORMAL
WBC: 26.2 10*3/uL — ABNORMAL HIGH (ref 4.0–10.5)
nRBC: 0 % (ref 0.0–0.2)

## 2020-02-27 MED ORDER — METHYLPREDNISOLONE SODIUM SUCC 125 MG IJ SOLR
INTRAMUSCULAR | Status: AC
  Administered 2020-02-27: 125 mg
  Filled 2020-02-27: qty 2

## 2020-02-27 MED ORDER — DIPHENHYDRAMINE HCL 25 MG PO CAPS: 25.0000 mg | ORAL_CAPSULE | Freq: Once | ORAL | Status: DC

## 2020-02-27 MED ORDER — HYDROXYZINE HCL 25 MG PO TABS
25.0000 mg | ORAL_TABLET | Freq: Once | ORAL | Status: AC
  Administered 2020-02-27: 25 mg via ORAL
  Filled 2020-02-27: qty 1

## 2020-02-27 MED ORDER — SODIUM CHLORIDE 0.9 % IV SOLN
INTRAVENOUS | Status: DC
Start: 2020-02-27 — End: 2020-02-28

## 2020-02-27 MED ORDER — DIPHENHYDRAMINE HCL 50 MG/ML IJ SOLN
INTRAMUSCULAR | Status: AC
  Administered 2020-02-27: 25 mg
  Filled 2020-02-27: qty 1

## 2020-02-27 MED ORDER — SODIUM CHLORIDE 0.9 % IV BOLUS
1000.0000 mL | Freq: Once | INTRAVENOUS | Status: AC
  Administered 2020-02-27: 1000 mL via INTRAVENOUS

## 2020-02-27 MED ORDER — FAMOTIDINE IN NACL 20-0.9 MG/50ML-% IV SOLN
INTRAVENOUS | Status: AC
  Administered 2020-02-27: 20 mg
  Filled 2020-02-27: qty 50

## 2020-02-27 MED ORDER — EPINEPHRINE 0.3 MG/0.3ML IJ SOAJ
0.3000 mg | Freq: Once | INTRAMUSCULAR | Status: AC
  Administered 2020-02-27: 0.3 mg via INTRAMUSCULAR
  Filled 2020-02-27: qty 0.3

## 2020-02-27 MED ORDER — EPINEPHRINE 0.3 MG/0.3ML IJ SOAJ
INTRAMUSCULAR | Status: AC
  Administered 2020-02-27: 0.3 mg
  Filled 2020-02-27: qty 0.3

## 2020-02-27 NOTE — ED Notes (Signed)
This RN went to re-assess pt and pt reported feeling like her fingers were swelling, her body was itching again and that blisters were forming on the inside of her lip; pt also reported feeling central chest pain; this RN informed EDP, who re-assessed pt and ordered Hydroxyzine; pt on monitor and does not show signs of respiratory distress

## 2020-02-27 NOTE — ED Provider Notes (Signed)
[place] EMERGENCY DEPARTMENT Provider Note   CSN: [id number] Arrival date & time: 02/27/20  1949     History Chief Complaint  Patient presents with  . Allergic Reaction    Wendy Tate is a 44 y.o. female.  The history is provided by the patient.  Allergic Reaction Presenting symptoms: itching, rash and swelling   Severity:  Mild Context: medications (maybe lidocaine?)   Relieved by:  Antihistamines Worsened by:  Nothing      Past Medical History:  Diagnosis Date  . Abnormal Pap smear 04/2007, 04/2009, 07/2011   ASCUS   . Chicken pox   . Seasonal allergies     Patient Active Problem List   Diagnosis Date Noted  . Prediabetes 01/02/2020  . Bilateral carpal tunnel syndrome 12/30/2016  . Obesity 03/18/2016  . ASCUS (atypical squamous cells of undetermined significance) on Pap smear 02/16/2012  . Hx LEEP (loop electrosurgical excision procedure 02/16/2012    Past Surgical History:  Procedure Laterality Date  . ADENOIDECTOMY    . APPENDECTOMY    . DILATATION & CURETTAGE/HYSTEROSCOPY WITH TRUECLEAR N/A 01/18/2013   Procedure: DILATATION & CURETTAGE/HYSTEROSCOPY WITH TRUECLEAR;  Surgeon: Betsy Coder, MD;  Location: [facility];  Service: Gynecology;  Laterality: N/A;  D&C Hysteroscopy with TruClear  . LEEP    . TONSILLECTOMY    . WISDOM TOOTH EXTRACTION       OB History    Gravida  0   Para      Term      Preterm      AB      Living        SAB      TAB      Ectopic      Multiple      Live Births              Family History  Problem Relation Age of Onset  . Heart disease Mother   . Diabetes Mother   . Heart disease Father   . Leukemia Father        died at age 61  . Arthritis Maternal Grandmother   . Stroke Maternal Grandmother   . Diabetes Maternal Grandmother     Social History   Tobacco Use  . Smoking status: Light Tobacco Smoker    Last attempt to quit: 08/04/2011    Years since quitting: 8.5  . Smokeless  tobacco: Never Used  . Tobacco comment: Socially  Vaping Use  . Vaping Use: Never used  Substance Use Topics  . Alcohol use: Yes    Alcohol/week: 1.0 standard drink    Types: 1 Standard drinks or equivalent per week    Comment: socially  . Drug use: No    Comment: hx.    Home Medications Prior to Admission medications   Medication Sig Start Date End Date Taking? Authorizing Provider  Biotin w/ Vitamins C & E (HAIR/SKIN/NAILS PO) Take by mouth.   Yes [provider]  Cholecalciferol (VITAMIN D3 PO) Take 2,000 Int'l Units by mouth daily.   Yes [provider]  methocarbamol (ROBAXIN) 500 MG tablet Take 1 tablet (500 mg total) by mouth every 8 (eight) hours as needed for muscle spasms. 01/03/20  Yes Copland, Gay Filler, MD  SUPER B COMPLEX/C PO Take by mouth.   Yes [provider]  TURMERIC PO Take 1,500 mg by mouth daily.   Yes [provider]  UNABLE TO FIND Take 1,400 mg by mouth daily. Med  Name: Fish Oil   Yes [provider]    Allergies    Patient has no known allergies.  Review of Systems   Review of Systems  Constitutional: Negative for chills and fever.  HENT: Negative for ear pain and sore throat.   Eyes: Negative for pain and visual disturbance.  Respiratory: Negative for cough and shortness of breath.   Cardiovascular: Negative for chest pain and palpitations.  Gastrointestinal: Negative for abdominal pain and vomiting.  Genitourinary: Negative for dysuria and hematuria.  Musculoskeletal: Negative for arthralgias and back pain.  Skin: Positive for itching and rash. Negative for color change.  Neurological: Negative for seizures and syncope.  All other systems reviewed and are negative.   Physical Exam Updated Vital Signs  ED Triage Vitals  Enc Vitals Group     BP 02/27/20 1953 (!) 94/53     Pulse Rate 02/27/20 1953 (!) 158     Resp 02/27/20 1953 20     Temp 02/27/20 1953 98.1 F (36.7 C)     Temp Source 02/27/20  1953 Oral     SpO2 02/27/20 1953 99 %     Weight 02/27/20 1955 200 lb (90.7 kg)     Height 02/27/20 1955 5' 4"  (1.626 m)     Head Circumference --      Peak Flow --      Pain Score 02/27/20 2016 0     Pain Loc --      Pain Edu? --      Excl. in [place]? --     Physical Exam Vitals and nursing note reviewed.  Constitutional:      General: She is not in acute distress.    Appearance: She is well-developed. She is not ill-appearing.  HENT:     Head: Normocephalic and atraumatic.     Nose: Nose normal.     Mouth/Throat:     Mouth: Mucous membranes are moist.     Pharynx: No oropharyngeal exudate or posterior oropharyngeal erythema.     Comments: No swelling of lips or tongue Eyes:     Extraocular Movements: Extraocular movements intact.     Conjunctiva/sclera: Conjunctivae normal.     Pupils: Pupils are equal, round, and reactive to light.  Cardiovascular:     Rate and Rhythm: Normal rate and regular rhythm.     Pulses: Normal pulses.     Heart sounds: Normal heart sounds. No murmur heard.   Pulmonary:     Effort: Pulmonary effort is normal. No respiratory distress.     Breath sounds: Normal breath sounds.  Abdominal:     Palpations: Abdomen is soft.     Tenderness: There is no abdominal tenderness.  Musculoskeletal:        General: Normal range of motion.     Cervical back: Normal range of motion and neck supple.  Skin:    General: Skin is warm and dry.     Capillary Refill: Capillary refill takes less than 2 seconds.     Findings: No rash.  Neurological:     Mental Status: She is alert.     ED Results / Procedures / Treatments   Labs (all labs ordered are listed, but only abnormal results are displayed) Labs Reviewed  CBC WITH DIFFERENTIAL/PLATELET - Abnormal; Notable for the following components:      Result Value   WBC 26.2 (*)    RBC 5.39 (*)    Hemoglobin 16.7 (*)    HCT 49.3 (*)  Neutro Abs 23.6 (*)    Abs Immature Granulocytes 0.19 (*)    All other  components within normal limits  BASIC METABOLIC PANEL - Abnormal; Notable for the following components:   Glucose, Bld 164 (*)    All other components within normal limits  RESPIRATORY PANEL BY RT PCR (FLU A&B, COVID)  PREGNANCY, URINE    EKG EKG Interpretation  Date/Time:  Tuesday February 27 2020 19:57:58 EDT Ventricular Rate:  119 PR Interval:    QRS Duration: 83 QT Interval:  312 QTC Calculation: 439 R Axis:   83 Text Interpretation: Sinus tachycardia Minimal ST depression, diffuse leads Confirmed by Lennice Sites [phone]) on 02/27/2020 8:03:16 PM   Radiology [place] AXILLARY NODE CORE BIOPSY LEFT  Result Date: 02/27/2020 CLINICAL DATA:  Patient presents for ultrasound-guided core needle biopsy of a left breast mass and 1 of several prominent left axillary lymph nodes. Patient has an elongated 15 mm 10 o'clock position left breast mass as well as bilateral prominent lymph nodes. EXAM: ULTRASOUND GUIDED LEFT BREAST CORE NEEDLE BIOPSY ULTRASOUND GUIDED LEFT AXILLARY LYMPH NODE CORE NEEDLE BIOPSY COMPARISON:  Previous exam(s). PROCEDURE: I met with the patient and we discussed the procedure of ultrasound-guided biopsy, including benefits and alternatives. We discussed the high likelihood of a successful procedure. We discussed the risks of the procedure, including infection, bleeding, tissue injury, clip migration, and inadequate sampling. Informed written consent was given. The usual time-out protocol was performed immediately prior to the procedure. LESION #1 10 o'clock position left breast mass. Lesion quadrant: Upper inner quadrant Using sterile technique and 1% Lidocaine as local anesthetic, under direct ultrasound visualization, a 12 gauge spring-loaded device was used to perform biopsy of the mass at 10 o'clock using an inferior approach. At the conclusion of the procedure ribbon shaped tissue marker clip was deployed into the biopsy cavity. LESION #2 left axillary lymph node. Lesion  location: Left axilla. Using sterile technique and 1% Lidocaine as local anesthetic, under direct ultrasound visualization, a 14 gauge spring-loaded device was used to perform biopsy of 1 of the prominent left axillary lymph nodes using an inferior approach. At the conclusion of the procedure [facility] tissue marker clip was deployed into the biopsy cavity. Follow up 2 view mammogram was performed and dictated separately. IMPRESSION: Ultrasound guided biopsy of a 10 o'clock position left breast mass and 1 of several prominent left axillary lymph nodes. No apparent complications. Electronically Signed   By: Lajean Manes M.D.   On: 02/27/2020 08:53   MM CLIP PLACEMENT LEFT  Result Date: 02/27/2020 CLINICAL DATA:  Status post ultrasound-guided core needle biopsy of a left breast mass and a prominent left axillary lymph node. EXAM: DIAGNOSTIC LEFT MAMMOGRAM POST ULTRASOUND BIOPSY COMPARISON:  Previous exam(s). FINDINGS: Mammographic images were obtained following ultrasound guided biopsy of left breast mass and a left axillary lymph node. The ribbon shaped biopsy marking clip is in expected position at the site of biopsy, within the upper inner breast. The HydroMARK biopsy clip in the left axilla cannot be included on the mammographic field of view but was seen sonographically within the biopsied node. IMPRESSION: Appropriate positioning of the ribbon shaped biopsy marking clip at the site of biopsy in the upper inner left breast. HydroMARK clip in the left axillary lymph node not visualized mammographically. Final Assessment: Post Procedure Mammograms for Marker Placement Electronically Signed   By: Lajean Manes M.D.   On: 02/27/2020 09:06   [place] LT BREAST BX W LOC DEV 1ST LESION IMG  BX SPEC US GUIDE  Result Date: 02/27/2020 CLINICAL DATA:  Patient presents for ultrasound-guided core needle biopsy of a left breast mass and 1 of several prominent left axillary lymph nodes. Patient has an elongated 15 mm 10  o'clock position left breast mass as well as bilateral prominent lymph nodes. EXAM: ULTRASOUND GUIDED LEFT BREAST CORE NEEDLE BIOPSY ULTRASOUND GUIDED LEFT AXILLARY LYMPH NODE CORE NEEDLE BIOPSY COMPARISON:  Previous exam(s). PROCEDURE: I met with the patient and we discussed the procedure of ultrasound-guided biopsy, including benefits and alternatives. We discussed the high likelihood of a successful procedure. We discussed the risks of the procedure, including infection, bleeding, tissue injury, clip migration, and inadequate sampling. Informed written consent was given. The usual time-out protocol was performed immediately prior to the procedure. LESION #1 10 o'clock position left breast mass. Lesion quadrant: Upper inner quadrant Using sterile technique and 1% Lidocaine as local anesthetic, under direct ultrasound visualization, a 12 gauge spring-loaded device was used to perform biopsy of the mass at 10 o'clock using an inferior approach. At the conclusion of the procedure ribbon shaped tissue marker clip was deployed into the biopsy cavity. LESION #2 left axillary lymph node. Lesion location: Left axilla. Using sterile technique and 1% Lidocaine as local anesthetic, under direct ultrasound visualization, a 14 gauge spring-loaded device was used to perform biopsy of 1 of the prominent left axillary lymph nodes using an inferior approach. At the conclusion of the procedure [facility] tissue marker clip was deployed into the biopsy cavity. Follow up 2 view mammogram was performed and dictated separately. IMPRESSION: Ultrasound guided biopsy of a 10 o'clock position left breast mass and 1 of several prominent left axillary lymph nodes. No apparent complications. Electronically Signed   By: Lajean Manes M.D.   On: 02/27/2020 08:53    Procedures .Critical Care Performed by: Lennice Sites, DO Authorized by: Lennice Sites, DO   Critical care provider statement:    Critical care time (minutes):  45    Critical care was necessary to treat or prevent imminent or life-threatening deterioration of the following conditions: anaphylaxis.   Critical care was time spent personally by me on the following activities:  Blood draw for specimens, development of treatment plan with patient or surrogate, discussions with primary provider, evaluation of patient's response to treatment, examination of patient, obtaining history from patient or surrogate, ordering and performing treatments and interventions, ordering and review of laboratory studies, ordering and review of radiographic studies, pulse oximetry, re-evaluation of patient's condition and review of old charts   I assumed direction of critical care for this patient from another provider in my specialty: no     (including critical care time)  Medications Ordered in ED Medications  methylPREDNISolone sodium succinate (SOLU-MEDROL) 125 mg/2 mL injection (125 mg  Given 02/27/20 2010)  diphenhydrAMINE (BENADRYL) 50 MG/ML injection (25 mg  Given 02/27/20 2010)  EPINEPHrine (EPI-PEN) 0.3 mg/0.3 mL injection (0.3 mg  Given 02/27/20 2001)  famotidine (PEPCID) 20-0.9 MG/50ML-% IVPB (  Stopped 02/27/20 2102)  hydrOXYzine (ATARAX/VISTARIL) tablet 25 mg (25 mg Oral Given 02/27/20 2206)  EPINEPHrine (EPI-PEN) injection 0.3 mg (0.3 mg Intramuscular Given 02/27/20 2232)  sodium chloride 0.9 % bolus 1,000 mL (1,000 mLs Intravenous New Bag/Given 02/27/20 2323)    ED Course  I have reviewed the triage vital signs and the nursing notes.  Pertinent labs & imaging results that were available during my care of the patient were reviewed by me and considered in my medical decision making (see chart for  details).    MDM Rules/Calculators/A&P                          Wendy Tate is a 44 year old female who presents to the ED with allergic reaction.  Patient with normal vitals.  No fever.  Took Benadryl prior to arrival.  States that she had hives to her abdomen.   Felt like her tongue and face were swollen.  However there are no hives, no facial swelling or tongue swelling on my exam.  She did have a breast biopsy and lymph node biopsy today with lidocaine.  See states that there are some titanium markers also.  However she has no redness or swelling around these areas.  Overall she has no wheezing.  However given the feeling of her throat closing off patient was given epinephrine, additional Benadryl, steroids, famotidine and fluid bolus.  We will continue to observe.  After 2 hours patient felt symptom-free.  However shortly after my reevaluation she started to feel some tingling in her hands and lips.  However there have been no obvious physical changes of hives.  No wheezing.  Normal vitals.  She does appear slightly flushed.  Will give an additional dose of Benadryl and continue observation.  No concern for anaphylaxis still.  Upon reevaluation after about 2-1/2 hours in the ED patient had diffuse rash again, shortness of breath and another dose of IM epinephrine was given.  Within 10 to 15 minutes symptoms vastly improved.  Is hemodynamically stable throughout event.  Given reactivation of anaphylactic type symptoms will admit to medicine for further care.  Basic labs to be drawn.  Suspect either lidocaine or so that was used possibly could be causing reaction.  Patient does have titanium markers but this seems less likely.  There is no severe redness or swelling at surgical sites.  Patient to be admitted to medicine.  Signed out to Dr. Leonette Monarch with patient pending transfer over to other hospital.  This chart was dictated using voice recognition software.  Despite best efforts to proofread,  errors can occur which can change the documentation meaning.     Final Clinical Impression(s) / ED Diagnoses Final diagnoses:  Allergic reaction, initial encounter  Anaphylaxis, initial encounter    Rx / DC Orders ED Discharge Orders    None       Lennice Sites, DO 02/27/20 2333

## 2020-02-27 NOTE — ED Notes (Signed)
This RN went to re-assess pt and pt was shivering; warm blankets provided and EDP informed

## 2020-02-27 NOTE — ED Notes (Signed)
This RN went to re-assess pt, noted pt still shaking and BP had dropped down under 100 SBP and 60 DBP; EDP informed, EDP [place] and [place] at bedside; pt BP returned to [facility] and shaking subsided within minutes; 1L Bolus ordered

## 2020-02-27 NOTE — ED Triage Notes (Signed)
Hives, facial swelling, generalized redness started 1 hour PTA. 22m benadryl taken 30 minutes PTA. Noted to have tongue swelling, controlling secretions.

## 2020-02-27 NOTE — ED Notes (Signed)
Consult to hospitalist completed, spoke with [facility].

## 2020-02-27 NOTE — ED Notes (Signed)
[facility] paged for Dr. Ronnald Nian

## 2020-02-28 ENCOUNTER — Emergency Department (HOSPITAL_BASED_OUTPATIENT_CLINIC_OR_DEPARTMENT_OTHER): Payer: 59

## 2020-02-28 ENCOUNTER — Telehealth: Payer: Self-pay

## 2020-02-28 DIAGNOSIS — T782XXA Anaphylactic shock, unspecified, initial encounter: Secondary | ICD-10-CM | POA: Diagnosis present

## 2020-02-28 DIAGNOSIS — I10 Essential (primary) hypertension: Secondary | ICD-10-CM | POA: Diagnosis not present

## 2020-02-28 LAB — URINALYSIS, COMPLETE (UACMP) WITH MICROSCOPIC
Bilirubin Urine: NEGATIVE
Glucose, UA: 100 mg/dL — AB
Hgb urine dipstick: NEGATIVE
Ketones, ur: NEGATIVE mg/dL
Leukocytes,Ua: NEGATIVE
Nitrite: NEGATIVE
Protein, ur: NEGATIVE mg/dL
RBC / HPF: NONE SEEN RBC/hpf (ref 0–5)
Specific Gravity, Urine: 1.015 (ref 1.005–1.030)
pH: 5.5 (ref 5.0–8.0)

## 2020-02-28 LAB — PREGNANCY, URINE: Preg Test, Ur: NEGATIVE

## 2020-02-28 MED ORDER — DIPHENHYDRAMINE HCL 50 MG/ML IJ SOLN
25.0000 mg | Freq: Once | INTRAMUSCULAR | Status: AC
  Administered 2020-02-28: 25 mg via INTRAVENOUS
  Filled 2020-02-28: qty 1

## 2020-02-28 MED ORDER — ALBUTEROL SULFATE HFA 108 (90 BASE) MCG/ACT IN AERS
6.0000 | INHALATION_SPRAY | Freq: Once | RESPIRATORY_TRACT | Status: AC
  Administered 2020-02-28: 6 via RESPIRATORY_TRACT
  Filled 2020-02-28: qty 6.7

## 2020-02-28 MED ORDER — PREDNISONE 10 MG PO TABS: ORAL_TABLET | ORAL | 0 refills | Status: DC

## 2020-02-28 MED ORDER — EPINEPHRINE 0.3 MG/0.3ML IJ SOAJ: 0.3000 mg | INTRAMUSCULAR | 1 refills | Status: DC | PRN

## 2020-02-28 MED ORDER — FAMOTIDINE IN NACL 20-0.9 MG/50ML-% IV SOLN
20.0000 mg | Freq: Once | INTRAVENOUS | Status: AC
  Filled 2020-02-28: qty 50

## 2020-02-28 MED ORDER — METHYLPREDNISOLONE SODIUM SUCC 125 MG IJ SOLR
80.0000 mg | Freq: Once | INTRAMUSCULAR | Status: AC
  Administered 2020-02-28: 80 mg via INTRAVENOUS
  Filled 2020-02-28: qty 2

## 2020-02-28 NOTE — ED Notes (Signed)
Pt has increase pink areas over skin, pt states arms are itching,pt have visual pink rasing areas over arms/legs/abd/face, pt denies pain at this time, BERO informed.

## 2020-02-28 NOTE — ED Notes (Signed)
Pt drinking diet ginger ale without difficulty.

## 2020-02-28 NOTE — Discharge Instructions (Addendum)
You were evaluated in the Emergency Department and after careful evaluation, we did not find any emergent condition requiring admission or further testing in the hospital.  Your exam/testing today is overall reassuring.  Your symptoms seem to be due to a severe allergic reaction.  You were observed in the emergency department for a number of hours and required several different medications to control your reaction.  At this time we feel that the allergic reaction has been controlled enough for you to manage it at home.  Please start the prednisone steroid medication tomorrow and take as directed in a tapered fashion.  Please go directly to the pharmacy and fill the prescription for the EpiPen and have it with you at all times.  Use if you are experiencing facial swelling or significant shortness of breath.  We also recommend over-the-counter Benadryl or Pepcid for mild itching or redness or other mild symptoms.  Please return to the Emergency Department if you experience any worsening of your condition or if you need to use the EpiPen..   Thank you for allowing [place] to be a part of your care.

## 2020-02-28 NOTE — ED Provider Notes (Signed)
  Provider Note MRN:  [id number]  Arrival date & time: 02/28/20    ED Course and Medical Decision Making  Assumed care from default provider at shift change.  Patient awaiting placement for admission for anaphylaxis, no exposure thought to be either titanium or truffle oil.  Patient did require 2 doses of EpiPen administration yesterday afternoon, but none since then.  Overnight she had some mild shortness of breath and was given albuterol with resolution.  She has required intermittent Benadryl for redness and itchy skin.  She has now been doing well for several hours, has not required epinephrine for 16 hours, has not required albuterol for 10 hours, vital signs are reassuring, warm and well-perfused in no acute distress.  When comparing my multiple assessments her skin changes now seem resolved.  Patient is requesting discharge, she is questioning the need for admission given her extended ED visit.  I tend to agree with her, I feel the remainder of her reaction can be managed as an outpatient.  Procedures  Final Clinical Impressions(s) / ED Diagnoses     ICD-10-CM   1. Allergic reaction, initial encounter  T78.40XA   2. Anaphylaxis, initial encounter  T78.2XXA   3. Pneumonia  J18.9 DG Chest 1 View    DG Chest 1 View    ED Discharge Orders         Ordered    predniSONE (DELTASONE) 10 MG tablet        02/28/20 1226    EPINEPHrine 0.3 mg/0.3 mL IJ SOAJ injection  As needed        02/28/20 1226            Discharge Instructions     You were evaluated in the Emergency Department and after careful evaluation, we did not find any emergent condition requiring admission or further testing in the hospital.  Your exam/testing today is overall reassuring.  Your symptoms seem to be due to a severe allergic reaction.  You were observed in the emergency department for a number of hours and required several different medications to control your reaction.  At this time we feel that the allergic  reaction has been controlled enough for you to manage it at home.  Please start the prednisone steroid medication tomorrow and take as directed in a tapered fashion.  Please go directly to the pharmacy and fill the prescription for the EpiPen and have it with you at all times.  Use if you are experiencing facial swelling or significant shortness of breath.  We also recommend over-the-counter Benadryl or Pepcid for mild itching or redness or other mild symptoms.  Please return to the Emergency Department if you experience any worsening of your condition or if you need to use the EpiPen..   Thank you for allowing [place] to be a part of your care.    Barth Kirks. [place] Small, [place] [email or web address]    Maudie Flakes, MD 02/28/20 [phone]

## 2020-02-28 NOTE — Telephone Encounter (Signed)
Nurse Assessment Nurse: Radford Pax, RN, Eugene Garnet Date/Time Eilene Ghazi Time): 02/27/2020 7:26:51 PM Confirm and document reason for call. If symptomatic, describe symptoms. ---Caller states that she had breast/node biopsy today and now has broke out in hives. Was initially reaching out for advice but has spoken to the doctor who performed the biopsy and has been instructed to go to ER for care. Has no other questions. Does the patient have any new or worsening symptoms? ---No Please document clinical information provided and list any resource used. ---Advised caller to call back with any further questions. Is going to [facility] ED after speaking with provider who performed biopsy. Disp. Time Eilene Ghazi Time) Disposition Final User 02/27/2020 7:31:20 PM Clinical Call Yes Radford Pax, RN, Eugene Garnet Comments User: Susie Cassette, RN Date/Time Eilene Ghazi Time): 02/27/2020 7:31:13 PM Advised caller to call back with any further questions. Is going to [facility] ED after speaking with provider who performed biopsy and advised her to go to ER. Referrals [facility] - ED  Pt at ED.

## 2020-02-28 NOTE — ED Notes (Signed)
Review D/C papers with pt, reviewed Rx with pt, pt states understanding, pt denies questions at this time.

## 2020-03-01 LAB — SURGICAL PATHOLOGY

## 2020-03-04 DIAGNOSIS — N939 Abnormal uterine and vaginal bleeding, unspecified: Secondary | ICD-10-CM | POA: Diagnosis not present

## 2020-03-04 DIAGNOSIS — N926 Irregular menstruation, unspecified: Secondary | ICD-10-CM | POA: Diagnosis not present

## 2020-03-06 ENCOUNTER — Other Ambulatory Visit: Payer: Self-pay

## 2020-03-06 ENCOUNTER — Encounter: Payer: Self-pay | Admitting: Allergy

## 2020-03-06 ENCOUNTER — Ambulatory Visit (INDEPENDENT_AMBULATORY_CARE_PROVIDER_SITE_OTHER): Payer: 59 | Admitting: Allergy

## 2020-03-06 VITALS — BP 126/82 | HR 85 | Temp 97.9°F | Resp 14 | Ht 64.25 in | Wt 205.8 lb

## 2020-03-06 DIAGNOSIS — T7840XD Allergy, unspecified, subsequent encounter: Secondary | ICD-10-CM | POA: Diagnosis not present

## 2020-03-06 NOTE — Progress Notes (Signed)
New Patient Note  RE: Wendy Tate MRN: [id number] DOB: [birth year] Date of Office Visit: 03/06/2020  Referring provider: Darreld Mclean, MD Primary care provider: Darreld Mclean, MD  Chief Complaint: reaction  History of present illness: Wendy Tate is a 44 y.o. female presenting today for consultation for allergic reaction.  Last Tuesday evening she had a reaction to something.  That morning she had a US guided cone biopsy to left breast and axillary LN.  She states titanium markers were placed.  The procedure ended about 830-9am.  Around 7pm she became itchy and developed a rash.  Then she started to note tongue swelling and she became short of breath and felt her heart racing.  Went to ED and was treated for an reaction.   Her exam in the ED was noted to be normal.  She was treated with IV medications including solumedrol, pepcid, benadryl and oral hydroxyzine as well as 2 doses of epipen.   She states she was prescribed additional prednisone to take upon discharge which she continues to take as well as Claritin. She states the rash resolved completely by 7pm the next day.  The itching and rash returned the following day, Thursday, and she took antihistamine medication and rash has not come back.   She states on Thursday she did take the steri-strips off the incisions.  She states she did not eat anything that evening around the time the reactions started.  Earlier in the day around 11 AM she ate a truffle mushroom hash (containing potatoes, bacon, eggs, lemon dressed arugula, parmesan cream sauce).  She states she eats red meat products often without any issue and has since, she also has had potatoes, eggs, limited and dairy products since.  She has never had an issue with mushrooms added.  For the procedure I am unable to find a procedural note.  However she states that the anesthetic was lidocaine.  She states she was prepped with an antiseptic and there was adhesive  used for drapery.  To her knowledge she has not had any tick or chigger bites.   She states the testing was done as she was found to have enlarged lymph nodes in the axilla after having a mammogram performed.  She does report she has had her Covid vaccine done in both arms but that was the last back in April.  She states testing thus far has been negative for malignancy or infection.    Review of systems: Review of Systems  Constitutional: Negative.   HENT: Negative.   Eyes: Negative.   Respiratory:       See HPI  Cardiovascular: Negative.   Gastrointestinal: Negative.   Musculoskeletal: Negative.   Skin: Positive for itching and rash.  Neurological: Negative.     All other systems negative unless noted above in HPI  Past medical history: Past Medical History:  Diagnosis Date  . Abnormal Pap smear 04/2007, 04/2009, 07/2011   ASCUS   . Chicken pox   . Seasonal allergies     Past surgical history: Past Surgical History:  Procedure Laterality Date  . ADENOIDECTOMY    . APPENDECTOMY    . DILATATION & CURETTAGE/HYSTEROSCOPY WITH TRUECLEAR N/A 01/18/2013   Procedure: DILATATION & CURETTAGE/HYSTEROSCOPY WITH TRUECLEAR;  Surgeon: Betsy Coder, MD;  Location: [facility];  Service: Gynecology;  Laterality: N/A;  D&C Hysteroscopy with TruClear  . LEEP    . TONSILLECTOMY    . WISDOM TOOTH EXTRACTION  Family history:  Family History  Problem Relation Age of Onset  . Heart disease Mother   . Diabetes Mother   . Heart disease Father   . Leukemia Father        died at age 39  . Arthritis Maternal Grandmother   . Stroke Maternal Grandmother   . Diabetes Maternal Grandmother   . Hodgkin's lymphoma Maternal Uncle     Social history: She lives in a home with carpeting in the bedroom with electric heating and central cooling.  Dogs in the home.  There is no concern for water damage, mildew or roaches in the home.  She is an Hydrographic surveyor.  She does smoke black and  mild cigars socially/rarely.  Medication List: Current Outpatient Medications  Medication Sig Dispense Refill  . Biotin w/ Vitamins C & E (HAIR/SKIN/NAILS PO) Take by mouth.    . Cholecalciferol (VITAMIN D3 PO) Take 2,000 Int'l Units by mouth daily.    Marland Kitchen EPINEPHrine 0.3 mg/0.3 mL IJ SOAJ injection Inject 0.3 mg into the muscle as needed for anaphylaxis. 1 each 1  . methocarbamol (ROBAXIN) 500 MG tablet Take 1 tablet (500 mg total) by mouth every 8 (eight) hours as needed for muscle spasms. 40 tablet 0  . predniSONE (DELTASONE) 10 MG tablet Take 4 tablets once a day for 3 days. Take 3 tablets once a day for the next 3 days. Take 2 tablets once a day for the next 3 days. Take 1 tablet once a day for the next 3 days. 30 tablet 0  . SUPER B COMPLEX/C PO Take by mouth.    . TURMERIC PO Take 1,500 mg by mouth daily.    Marland Kitchen UNABLE TO FIND Take 1,400 mg by mouth daily. Med Name: Fish Oil     No current facility-administered medications for this visit.    Known medication allergies: No Known Allergies   Physical examination: Blood pressure 126/82, pulse 85, temperature 97.9 F (36.6 C), resp. rate 14, height 5' 4.25" (1.632 m), weight 205 lb 12.8 oz (93.4 kg), SpO2 98 %.  General: Alert, interactive, in no acute distress. HEENT: PERRLA, TMs pearly gray, turbinates non-edematous without discharge, post-pharynx non erythematous. Neck: Supple without lymphadenopathy. Lungs: Clear to auscultation without wheezing, rhonchi or rales. {no increased work of breathing. CV: Normal S1, S2 without murmurs. Abdomen: Nondistended, nontender. Skin: Warm and dry, without lesions or rashes. Extremities:  No clubbing, cyanosis or edema. Neuro:   Grossly intact.  Diagnositics/Labs: None today  Assessment and plan:   Allergic reaction  -At this time her reaction is unknown to the culprit.  She had a procedure done earlier in the day with metal markers placed in the breast and axilla.  Many hours later she  developed what appears to be an urticarial rash as well as oral swelling with difficulty breathing.  She was managed in the ED for anaphylaxis with standard medications.  She had return of the hive rash that was able to be managed with antihistamines.  -At this time with the delayed response of the symptoms following the procedure I am less convinced that she was reacting to items from the procedure.  However, but want to rule these out.  We did discuss performing patch testing for metals to rule out a possible contact allergy with the titanium metals in place.  I advised if she can procure a titanium marker from the doctor who performed the procedure that we can perform patch testing to the marker itself.  Patch testing is best placed on a Monday with readings on Wednesday and Friday at the same week.  Antihistamines can be taken while patches are in place.  Systemic steroid medications however can interfere with the results of the test.  What want her to be steroid free for at least 2 weeks if not more before doing patch testing. -She is asymptomatic now thus I do not feel that she needs to continue taking prednisone -Also discussed alpha gal allergy.  With the delayed timing of the symptoms and she did have red meat ingestion earlier in the day this is the most likely trigger of her symptoms.  Alpha gal allergy can result in anaphylaxis but can occur hours (usually greater than 3 hours) from the red meat ingestion.  Alpha gal allergy is also not consistent this you can have red meat ingestion without having any issues.  We will obtain alpha gal panel as well as a tryptase level today.  Tryptase also looks to see if she may have any evidence of mast cell activation syndrome or mastocytosis.  This potentially could lead to an unprovoked and random allergic reaction. -We will reach out to her doctor who performed the procedure to get a procedural note to determine if we need to test for any of the items from the  procedure namely lidocaine. -She does have access to an epinephrine device at this time.  She has been provided with an emergency action plan in case she were to have a reaction in the future.  Should significant symptoms recur or new symptoms occur, a journal is to be kept recording any foods eaten, beverages consumed, medications taken, activities performed, and environmental conditions within a 6 hour time period prior to the onset of symptoms. For any symptoms concerning for anaphylaxis, epinephrine is to be administered and 911 is to be called immediately.   Follow-up for patch test placement   I appreciate the opportunity to take part in Wendy Tate's care. Please do not hesitate to contact me with questions.  Sincerely,   Prudy Feeler, MD Allergy/Immunology [facility]

## 2020-03-06 NOTE — Patient Instructions (Signed)
Allergic reaction  -At this time her reaction is unknown to the culprit.  She had a procedure done earlier in the day with metal markers placed in the breast and axilla.  Many hours later she developed what appears to be an urticarial rash as well as oral swelling with difficulty breathing.  She was managed in the ED for anaphylaxis with standard medications.  She had return of the hive rash that was able to be managed with antihistamines.  -At this time with the delayed response of the symptoms following the procedure I am less convinced that she was reacting to items from the procedure.  However, but want to rule these out.  We did discuss performing patch testing for metals to rule out a possible contact allergy with the titanium metals in place.  I advised if she can procure a titanium marker from the doctor who performed the procedure that we can perform patch testing to the marker itself.  Patch testing is best placed on a Monday with readings on Wednesday and Friday at the same week.  Antihistamines can be taken while patches are in place.  Systemic steroid medications however can interfere with the results of the test.  What want her to be steroid free for at least 2 weeks if not more before doing patch testing. -She is asymptomatic now thus I do not feel that she needs to continue taking prednisone -Also discussed alpha gal allergy.  With the delayed timing of the symptoms and she did have red meat ingestion earlier in the day this is the most likely trigger of her symptoms.  Alpha gal allergy can result in anaphylaxis but can occur hours (usually greater than 3 hours) from the red meat ingestion.  Alpha gal allergy is also not consistent this you can have red meat ingestion without having any issues.  We will obtain alpha gal panel as well as a tryptase level today.  Tryptase also looks to see if she may have any evidence of mast cell activation syndrome or mastocytosis.  This potentially could lead to  an unprovoked and random allergic reaction. -We will reach out to her doctor who performed the procedure to get a procedural note to determine if we need to test for any of the items from the procedure namely lidocaine. -She does have access to an epinephrine device at this time.  She has been provided with an emergency action plan in case she were to have a reaction in the future.  Should significant symptoms recur or new symptoms occur, a journal is to be kept recording any foods eaten, beverages consumed, medications taken, activities performed, and environmental conditions within a 6 hour time period prior to the onset of symptoms. For any symptoms concerning for anaphylaxis, epinephrine is to be administered and 911 is to be called immediately.   Follow-up for patch test placement

## 2020-03-19 ENCOUNTER — Telehealth: Payer: Self-pay

## 2020-03-19 ENCOUNTER — Ambulatory Visit: Payer: Self-pay | Admitting: Allergy

## 2020-03-19 LAB — ALPHA-GAL PANEL
Alpha Gal IgE*: <0.1 kU/L (ref <0.10)
Beef (Bos spp) IgE: <0.1 kU/L (ref <0.35)
Class Interpretation: 0
Class Interpretation: 0
Class Interpretation: 0
Lamb/Mutton (Ovis spp) IgE: <0.1 kU/L (ref <0.35)
Pork (Sus spp) IgE: <0.1 kU/L (ref <0.35)

## 2020-03-19 LAB — TRYPTASE: Tryptase: 3.4 ug/L (ref 2.2–13.2)

## 2020-03-19 NOTE — Telephone Encounter (Signed)
-----   Message from [place], MD sent at 03/19/2020  1:50 PM EDT ----- Please let patient know the following: -Alpha gal panel is negative thus does not have a red meat allergy -Tryptase is normal thus does not have "hyperreactive" allergy cells

## 2020-03-19 NOTE — Telephone Encounter (Signed)
Called pt. No answer. Left message to call office.

## 2020-03-22 ENCOUNTER — Ambulatory Visit: Payer: 59 | Admitting: Physical Therapy

## 2020-03-28 ENCOUNTER — Encounter: Payer: 59 | Admitting: Physical Therapy

## 2020-03-30 ENCOUNTER — Ambulatory Visit: Payer: 59 | Attending: Internal Medicine

## 2020-03-30 DIAGNOSIS — Z23 Encounter for immunization: Secondary | ICD-10-CM

## 2020-03-30 NOTE — Progress Notes (Signed)
   Covid-19 Vaccination Clinic  Name:  Wendy Tate    MRN: [id number] DOB: 08-Aug-1975  03/30/2020  Wendy Tate was observed post Covid-19 immunization for 15 minutes without incident. She was provided with Vaccine Information Sheet and instruction to access the V-Safe system.   Wendy Tate was instructed to call 911 with any severe reactions post vaccine: Marland Kitchen Difficulty breathing  . Swelling of face and throat  . A fast heartbeat  . A bad rash all over body  . Dizziness and weakness   Immunizations Administered    Name Date Dose VIS Date Route   Pfizer COVID-19 Vaccine 03/30/2020 10:50 AM 0.3 mL 03/06/2020 Intramuscular   Manufacturer: [place]   Lot: [id number]   [place]: [id number]

## 2020-04-01 ENCOUNTER — Ambulatory Visit: Payer: 59 | Admitting: Allergy

## 2020-04-03 ENCOUNTER — Encounter: Payer: 59 | Admitting: Allergy

## 2020-04-04 ENCOUNTER — Encounter: Payer: 59 | Admitting: Physical Therapy

## 2020-04-05 ENCOUNTER — Encounter: Payer: 59 | Admitting: Family

## 2020-04-09 ENCOUNTER — Encounter: Payer: 59 | Admitting: Physical Therapy

## 2020-04-15 DIAGNOSIS — N939 Abnormal uterine and vaginal bleeding, unspecified: Secondary | ICD-10-CM | POA: Diagnosis not present

## 2020-04-16 ENCOUNTER — Encounter: Payer: 59 | Admitting: Physical Therapy

## 2020-04-16 ENCOUNTER — Other Ambulatory Visit: Payer: Self-pay | Admitting: Obstetrics and Gynecology

## 2020-04-16 DIAGNOSIS — N840 Polyp of corpus uteri: Secondary | ICD-10-CM | POA: Diagnosis not present

## 2020-04-23 ENCOUNTER — Encounter: Payer: 59 | Admitting: Physical Therapy

## 2020-04-30 ENCOUNTER — Encounter: Payer: 59 | Admitting: Physical Therapy

## 2020-05-07 ENCOUNTER — Encounter: Payer: 59 | Admitting: Physical Therapy

## 2020-12-30 IMAGING — US US BREAST*L* LIMITED INC AXILLA
1 series · 13 of 24 positions shown · non-contrast
Comparison: Previous exam(s).

CLINICAL DATA: 44-year-old female recalled from screening mammogram
dated 02/07/2020 for a possible left breast mass.

EXAM:
DIGITAL DIAGNOSTIC LEFT MAMMOGRAM WITH CAD AND TOMO
ULTRASOUND LEFT BREAST

[Series 1: us breast*left* limited inc axilla · 0.08mm/px · 13 of 24 slices shown]
[im 1/24]
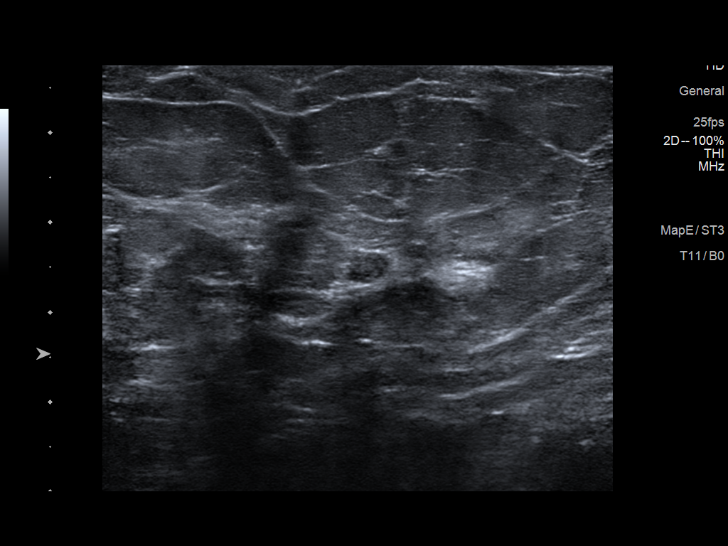
[im 3/24]
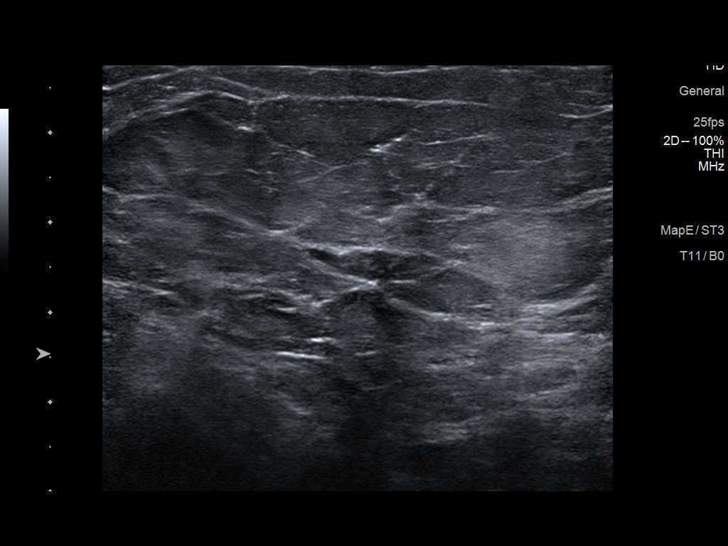
[im 5/24]
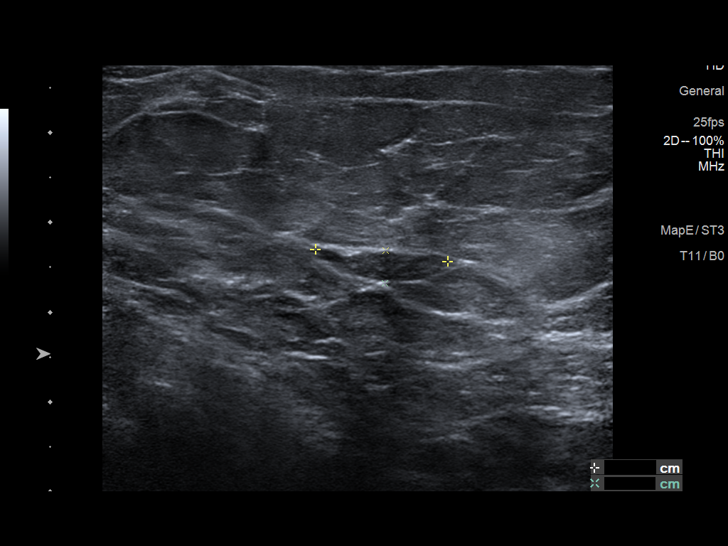
[im 7/24]
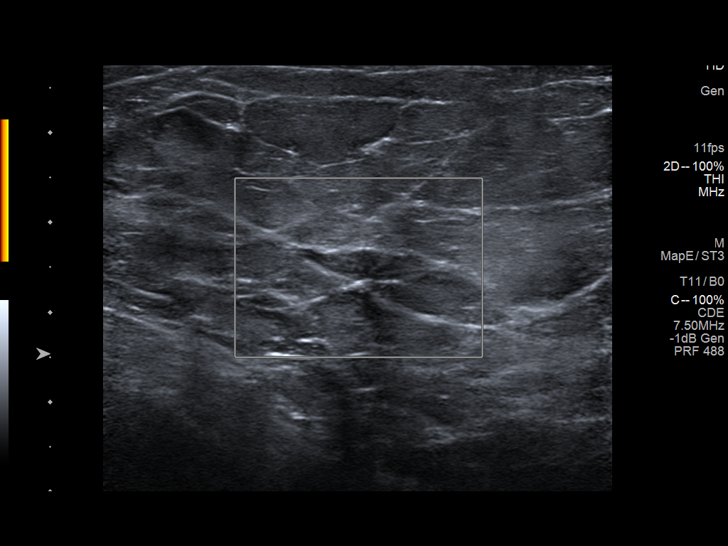
[im 9/24]
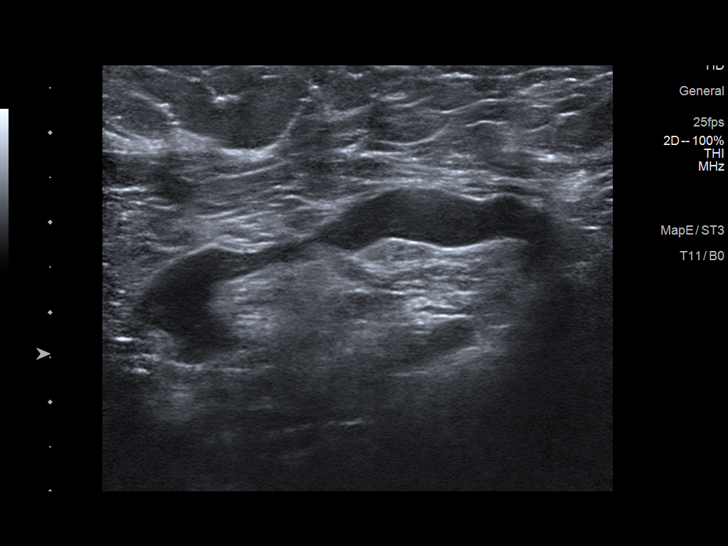
[im 11/24]
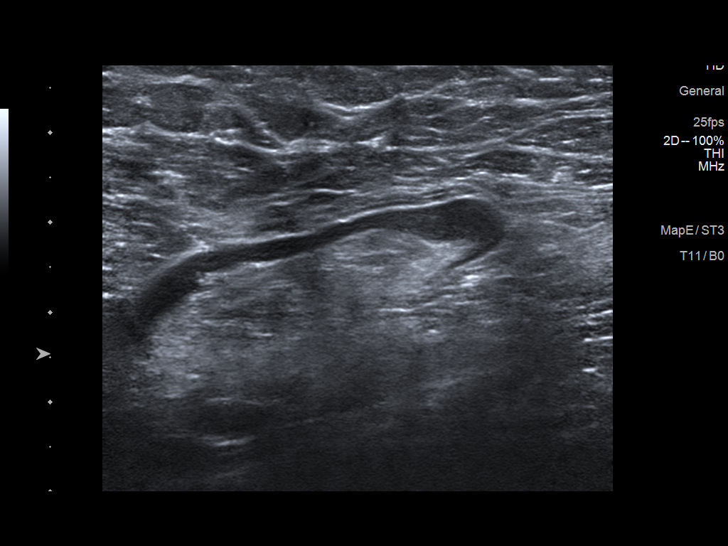
[im 13/24]
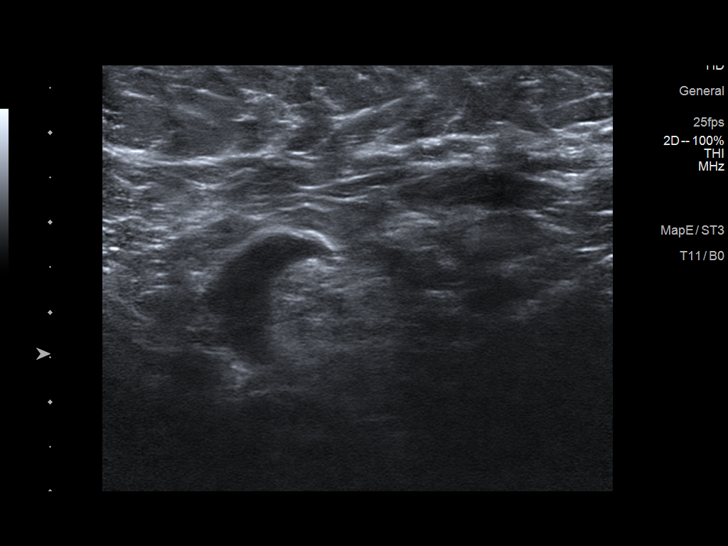
[im 14/24]
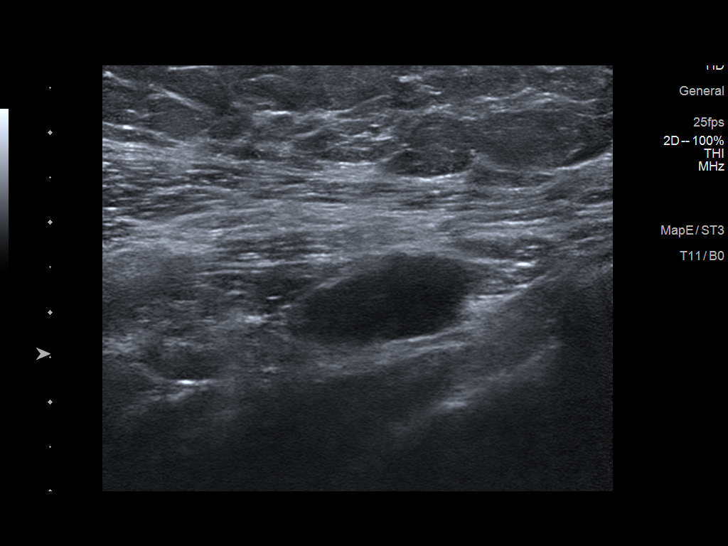
[im 16/24]
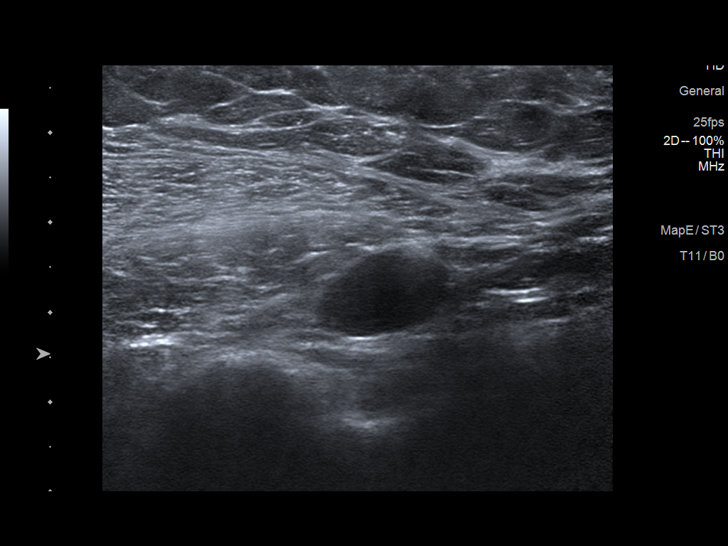
[im 18/24]
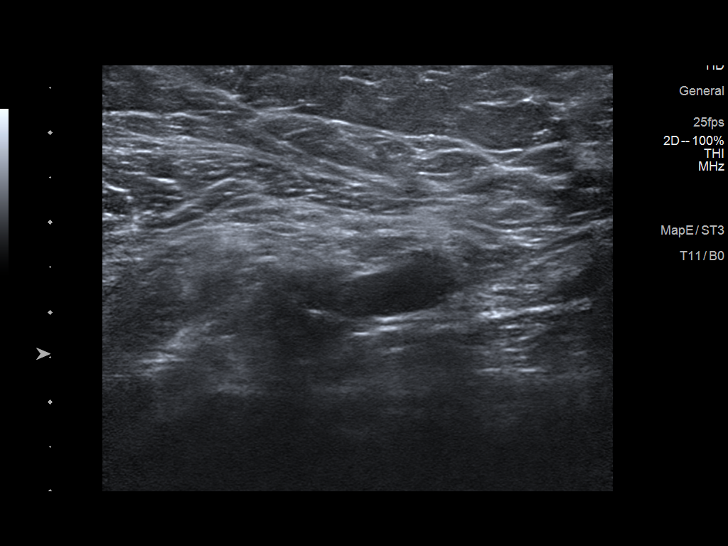
[im 20/24]
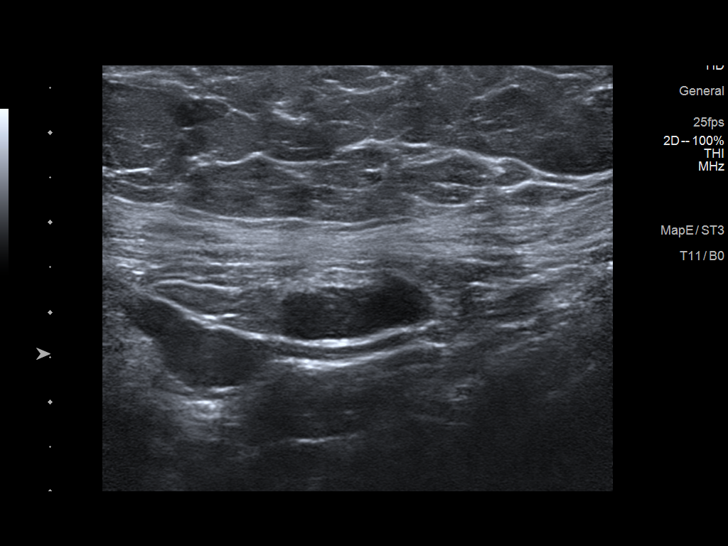
[im 22/24]
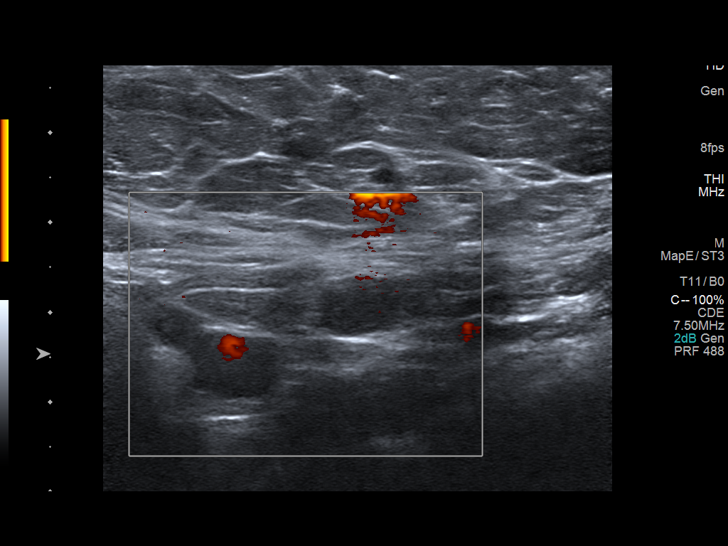
[im 24/24]
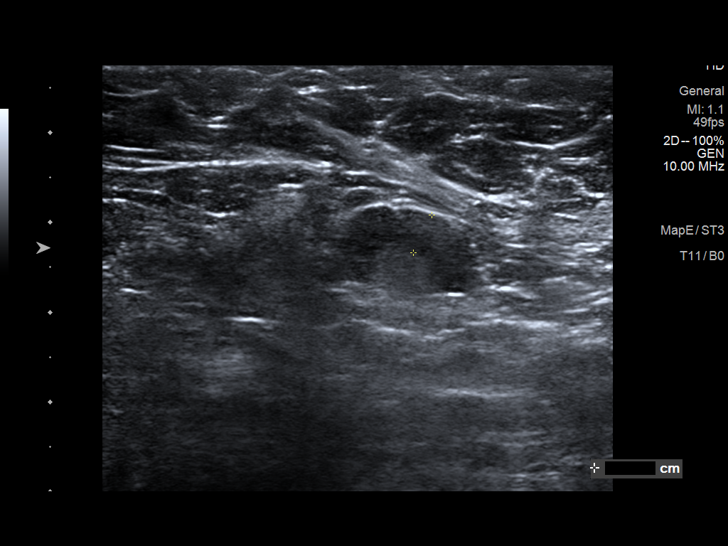

[13 of 24 positions shown; findings below may reference images not displayed]

ACR Breast Density Category b: There are scattered areas of
fibroglandular density.
FINDINGS: There is a persistent asymmetry in the slightly medial left breast
at mid to posterior depth, best seen the cc projection. A possible
correlate is seen in the slightly superior aspect on the MLO spot
projection, though it is less prominent on this view. Further
evaluation with ultrasound was performed.

Mammographic images were processed with CAD.

Targeted ultrasound is performed, showing a circumscribed, slightly
irregular hypoechoic mass at the 10 o'clock position 7 cm from the
nipple. It measures 1.5 x 1.4 x 0.4 cm. There is no internal
vascularity. This likely corresponds with the mammographic finding.

Evaluation of the left axilla demonstrates multiple prominent lymph
nodes with diffuse cortical thickening up to 5 mm. Evaluation of the
right axilla for comparison demonstrates a similar appearance to the
lymph nodes.
IMPRESSION: 1. Indeterminate left breast mass at the 10 o'clock position 7 cm
from the nipple. Recommendation is for ultrasound-guided biopsy.
2. Indeterminate, prominent lymph nodes bilaterally with diffuse
cortical thickness up to 5 mm. Differential includes reactive
changes versus lymphoproliferative disease, with metastatic disease
less likely. Recommendation is for ultrasound-guided biopsy of a
left axillary lymph node.

RECOMMENDATION:
Two area ultrasound-guided biopsy of the left breast and left
axilla.

I have discussed the findings and recommendations with the patient.
If applicable, a reminder letter will be sent to the patient
regarding the next appointment.

BI-RADS CATEGORY  4: Suspicious.

## 2020-12-30 IMAGING — MG MM DIGITAL DIAGNOSTIC UNILAT*L* W/ TOMO W/ CAD
4 series · 4 of 12 positions shown · non-contrast
Comparison: Previous exam(s).

CLINICAL DATA: 44-year-old female recalled from screening mammogram
dated 02/07/2020 for a possible left breast mass.

EXAM:
DIGITAL DIAGNOSTIC LEFT MAMMOGRAM WITH CAD AND TOMO
ULTRASOUND LEFT BREAST

[L CC synth-2D]
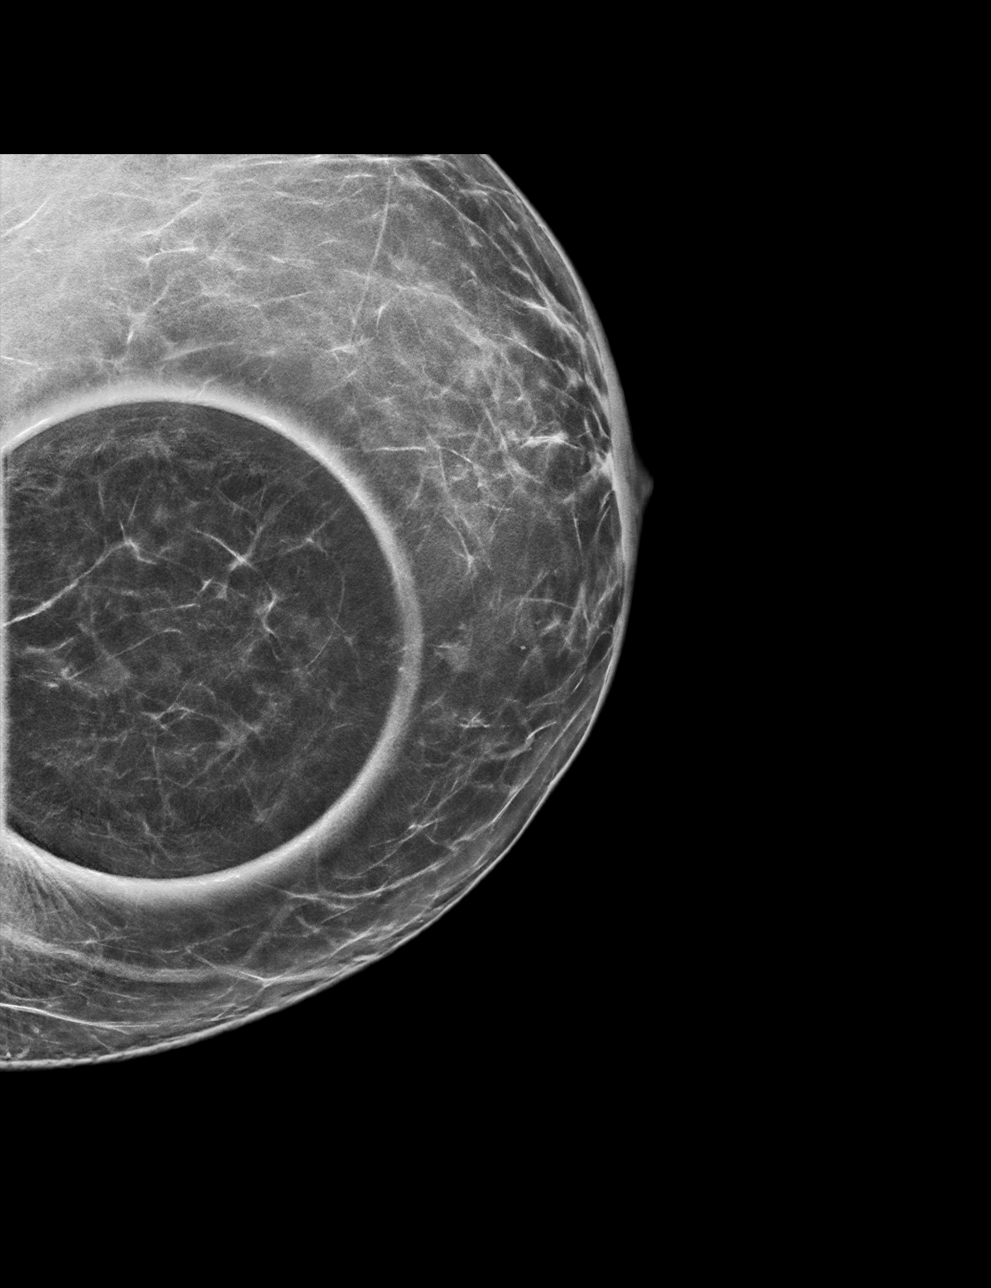

[L MLO synth-2D]
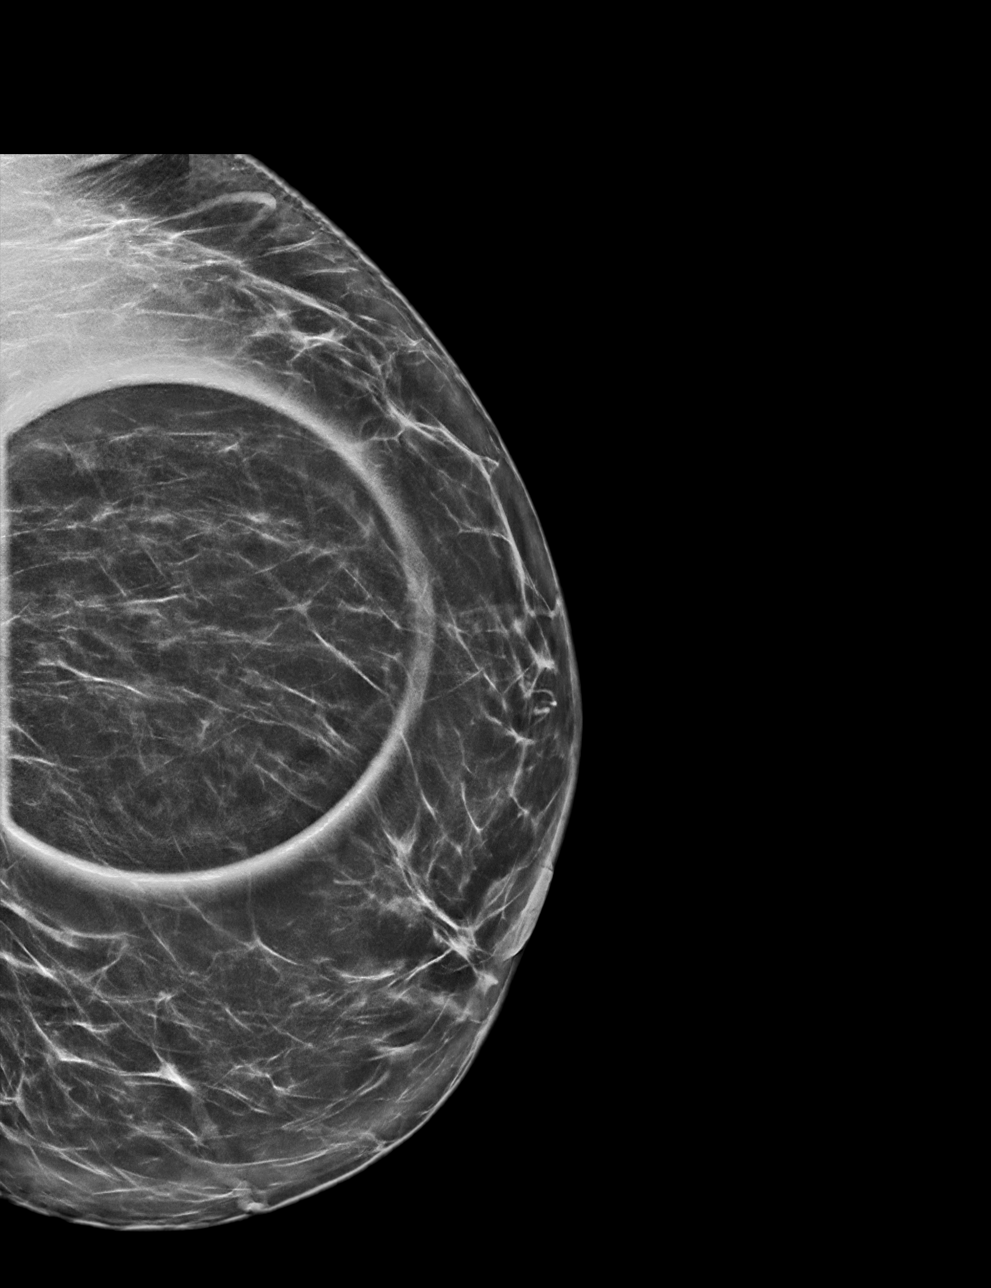

[L MLO tomo · tomo slice 40/79.0]
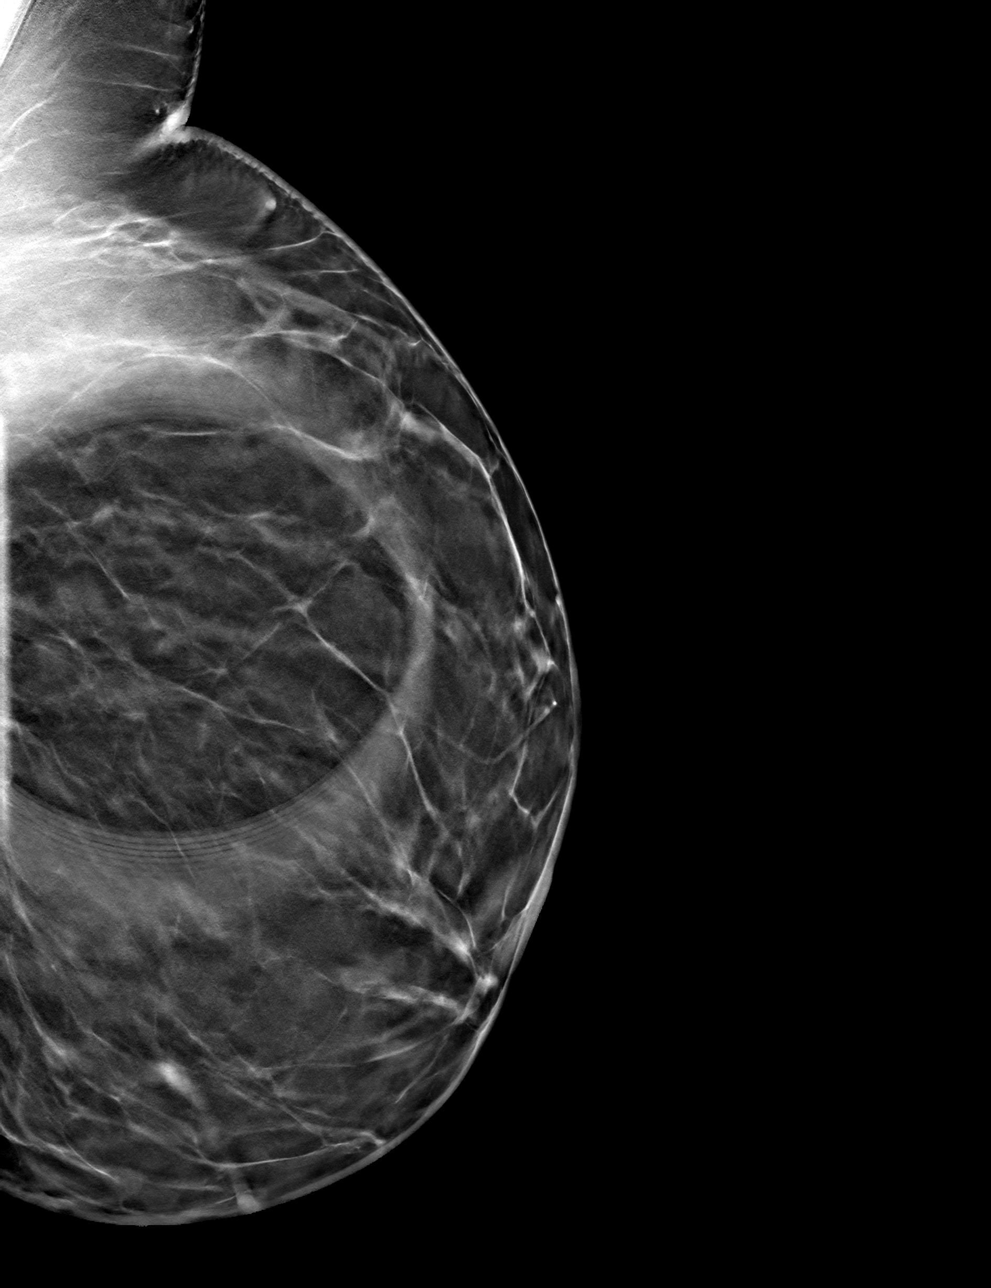

[L CC tomo · tomo slice 33/64.0]
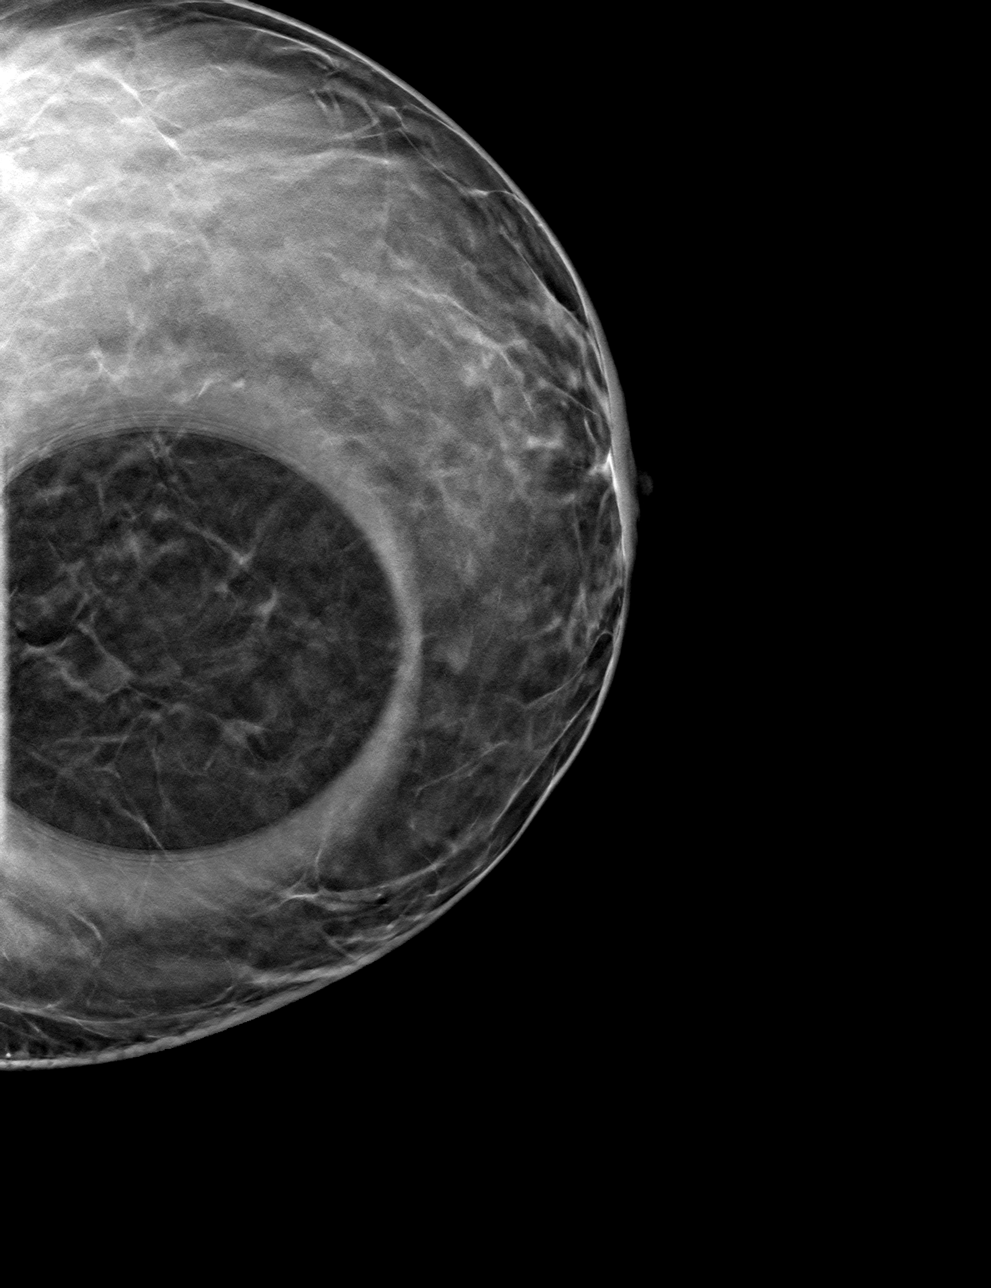

[4 of 12 positions shown; findings below may reference images not displayed]

ACR Breast Density Category b: There are scattered areas of
fibroglandular density.
FINDINGS: There is a persistent asymmetry in the slightly medial left breast
at mid to posterior depth, best seen the cc projection. A possible
correlate is seen in the slightly superior aspect on the MLO spot
projection, though it is less prominent on this view. Further
evaluation with ultrasound was performed.

Mammographic images were processed with CAD.

Targeted ultrasound is performed, showing a circumscribed, slightly
irregular hypoechoic mass at the 10 o'clock position 7 cm from the
nipple. It measures 1.5 x 1.4 x 0.4 cm. There is no internal
vascularity. This likely corresponds with the mammographic finding.

Evaluation of the left axilla demonstrates multiple prominent lymph
nodes with diffuse cortical thickening up to 5 mm. Evaluation of the
right axilla for comparison demonstrates a similar appearance to the
lymph nodes.
IMPRESSION: 1. Indeterminate left breast mass at the 10 o'clock position 7 cm
from the nipple. Recommendation is for ultrasound-guided biopsy.
2. Indeterminate, prominent lymph nodes bilaterally with diffuse
cortical thickness up to 5 mm. Differential includes reactive
changes versus lymphoproliferative disease, with metastatic disease
less likely. Recommendation is for ultrasound-guided biopsy of a
left axillary lymph node.

RECOMMENDATION:
Two area ultrasound-guided biopsy of the left breast and left
axilla.

I have discussed the findings and recommendations with the patient.
If applicable, a reminder letter will be sent to the patient
regarding the next appointment.

BI-RADS CATEGORY  4: Suspicious.

## 2021-01-09 ENCOUNTER — Other Ambulatory Visit: Payer: Self-pay | Admitting: Family Medicine

## 2021-01-09 DIAGNOSIS — Z1231 Encounter for screening mammogram for malignant neoplasm of breast: Secondary | ICD-10-CM

## 2021-02-07 NOTE — Progress Notes (Addendum)
[facility] [address] [place], [place] [address] [phone]  Date:  02/10/2021   Name:  Wendy Tate   DOB:  1975-08-19   MRN:  [id number]  PCP:  Darreld Mclean, MD    Chief Complaint: Annual Exam (Concerns/ questions: pt has some urinary incontinence since the end of July. /Flu shot today: declines/Sees GYN- [place])   History of Present Illness:  Wendy Tate is a 45 y.o. very pleasant female patient who presents with the following:  Patient seen today for physical exam Most recent visit with myself about 1 year ago  She has been seen by GYN, Dr. Charlesetta Garibaldi for abnormal uterine bleeding She does not have any sign of cancer.  They are thinking of doing a hysterectomy at some point, however she is not currently actively pursuing this  Since July of this year she has noted urinary incontinence It has gotten worse and over the last month she may not even notice that she is losing urine  Not stress incontinence- she can actually hold ok when she does a kegel with cough or sneeze Never had any incontinence prior to the summer No bowel incontinence symptoms, no genital numbness  She notes that she may get "electrical sensations" down her legs bilaterally, and sometimes numbness- She has noted this for about 5-6 years Not getting worse- this is actually better No leg weakness She did see neurology for an evaluation- did not get a lumbar MRI or other imaging She does not have genital numbness   Flu vaccine- will do at work  COVID-19 booster- encouraged her to do this  Negative Cologuard 1 year ago Mammogram up-to-date- she back to screening annually and has this scheduled   She had an abnormal mammogram and ultrasound last year which led to a breast biopsy in October.  It looks like these results were all benign.   She reports that she is back on annual screening only  Will obtain blood work  today- she is fasting  Wt Readings from Last 3 Encounters:  02/10/21 168 lb 3.2 oz (76.3 kg)  03/06/20 205 lb 12.8 oz (93.4 kg)  02/27/20 200 lb (90.7 kg)   She has lost weight intentionally as above LMP is current  Patient Active Problem List   Diagnosis Date Noted   Anaphylaxis 02/28/2020   Prediabetes 01/02/2020   Bilateral carpal tunnel syndrome 12/30/2016   Obesity 03/18/2016   ASCUS (atypical squamous cells of undetermined significance) on Pap smear 02/16/2012   Hx LEEP (loop electrosurgical excision procedure 02/16/2012    Past Medical History:  Diagnosis Date   Abnormal Pap smear 04/2007, 04/2009, 07/2011   ASCUS    Chicken pox    Seasonal allergies     Past Surgical History:  Procedure Laterality Date   ADENOIDECTOMY     APPENDECTOMY     DILATATION & CURETTAGE/HYSTEROSCOPY WITH TRUECLEAR N/A 01/18/2013   Procedure: DILATATION & CURETTAGE/HYSTEROSCOPY WITH TRUECLEAR;  Surgeon: Betsy Coder, MD;  Location: [facility];  Service: Gynecology;  Laterality: N/A;  D&C Hysteroscopy with TruClear   LEEP     TONSILLECTOMY     WISDOM TOOTH EXTRACTION      Social History   Tobacco Use   Smoking status: Light Smoker    Types: Cigarettes    Last attempt to quit: 08/04/2011    Years since quitting: 9.5   Smokeless tobacco: Never   Tobacco comments:  Socially  Vaping Use   Vaping Use: Never used  Substance Use Topics   Alcohol use: Yes    Alcohol/week: 1.0 standard drink    Types: 1 Standard drinks or equivalent per week    Comment: socially   Drug use: No    Comment: hx.    Family History  Problem Relation Age of Onset   Heart disease Mother    Diabetes Mother    Heart disease Father    Leukemia Father        died at age 13   Arthritis Maternal Grandmother    Stroke Maternal Grandmother    Diabetes Maternal Grandmother    Hodgkin's lymphoma Maternal Uncle     No Known Allergies  Medication list has been reviewed and updated.  Current Outpatient  Medications on File Prior to Visit  Medication Sig Dispense Refill   Biotin w/ Vitamins C & E (HAIR/SKIN/NAILS PO) Take by mouth.     Cholecalciferol (VITAMIN D3 PO) Take 2,000 Int'l Units by mouth daily.     EPINEPHrine 0.3 mg/0.3 mL IJ SOAJ injection Inject 0.3 mg into the muscle as needed for anaphylaxis. 1 each 1   methocarbamol (ROBAXIN) 500 MG tablet Take 1 tablet (500 mg total) by mouth every 8 (eight) hours as needed for muscle spasms. 40 tablet 0   SUPER B COMPLEX/C PO Take by mouth.     TURMERIC PO Take 1,500 mg by mouth daily.     UNABLE TO FIND Take 1,400 mg by mouth daily. Med Name: Fish Oil     No current facility-administered medications on file prior to visit.    Review of Systems:  As per HPI- otherwise negative.   Physical Examination: Vitals:   02/10/21 1003  BP: 110/72  Pulse: 97  Resp: 18  Temp: 97.9 F (36.6 C)  SpO2: 99%   Vitals:   02/10/21 1003  Weight: 168 lb 3.2 oz (76.3 kg)  Height: 5' 4"  (1.626 m)   Body mass index is 28.87 kg/m. Ideal Body Weight: Weight in (lb) to have BMI = 25: 145.3  GEN: no acute distress. Mild overweight, she has lost weight intentionally  HEENT: Atraumatic, Normocephalic.   Bilateral TM wnl, oropharynx normal.  PEERL,EOMI.   Ears and Nose: No external deformity. CV: RRR, No M/G/R. No JVD. No thrill. No extra heart sounds. PULM: CTA B, no wheezes, crackles, rhonchi. No retractions. No resp. distress. No accessory muscle use. ABD: S, NT, ND. No rebound. No HSM. EXTR: No c/c/e PSYCH: Normally interactive. Conversant.  Normal BLE strength, sensation No saddle anesthesia Reduced but symmetrical patellar DTR Pt declines pelvic exam today   Assessment and Plan: Physical exam  Prediabetes - Plan: Comprehensive metabolic panel, Hemoglobin A1c  Screening for deficiency anemia - Plan: CBC  Screening for hyperlipidemia - Plan: Lipid panel  Screening for thyroid disorder - Plan: TSH  Fatigue, unspecified type - Plan:  TSH, VITAMIN D 25 Hydroxy (Vit-D Deficiency, Fractures)  Urinary incontinence without sensory awareness - Plan: DG Lumbar Spine Complete, mirabegron ER (MYRBETRIQ) 25 MG TB24 tablet, Urine Culture   Physical exam today.  Encouraged healthy diet and exercise routine Congratulated her on desired weight loss Her main concern today is urinary incontinence present for about 3 months.  It started with urge incontinence but now she may urinate without even being aware of it.  She does also have a longer history of bilateral lower extremity numbness and tingling.  We will obtain labs as above and a  urine culture, plain films of the lumbar spine.  Assuming these are nonrevealing plan to proceed to lumbar MRI and urology referral.  I also prescribed Myrbetriq which she can try  Will plan further follow- up pending labs.   This visit occurred during the SARS-CoV-2 public health emergency.  Safety protocols were in place, including screening questions prior to the visit, additional usage of staff PPE, and extensive cleaning of exam room while observing appropriate contact time as indicated for disinfecting solutions.   Signed Lamar Blinks, MD  Received her labs as below, message to patient  Results for orders placed or performed in visit on 02/10/21  CBC  Result Value Ref Range   WBC 6.2 4.0 - 10.5 K/uL   RBC 4.28 3.87 - 5.11 Mil/uL   Platelets 221.0 150.0 - 400.0 K/uL   Hemoglobin 13.5 12.0 - 15.0 g/dL   HCT 40.4 36.0 - 46.0 %   MCV 94.4 78.0 - 100.0 fl   MCHC 33.6 30.0 - 36.0 g/dL   RDW 13.3 11.5 - 15.5 %  Comprehensive metabolic panel  Result Value Ref Range   Sodium 139 135 - 145 mEq/L   Potassium 3.8 3.5 - 5.1 mEq/L   Chloride 101 96 - 112 mEq/L   CO2 29 19 - 32 mEq/L   Glucose, Bld 90 70 - 99 mg/dL   BUN 13 6 - 23 mg/dL   Creatinine, Ser 0.73 0.40 - 1.20 mg/dL   Total Bilirubin 0.4 0.2 - 1.2 mg/dL   Alkaline Phosphatase 59 39 - 117 U/L   AST 19 0 - 37 U/L   ALT 30 0 - 35 U/L    Total Protein 6.5 6.0 - 8.3 g/dL   Albumin 4.4 3.5 - 5.2 g/dL   GFR 98.57 >60.00 mL/min   Calcium 9.1 8.4 - 10.5 mg/dL  Hemoglobin A1c  Result Value Ref Range   Hgb A1c MFr Bld 5.7 4.6 - 6.5 %  Lipid panel  Result Value Ref Range   Cholesterol 156 0 - 200 mg/dL   Triglycerides 54.0 0.0 - 149.0 mg/dL   HDL 39.30 >39.00 mg/dL   VLDL 10.8 0.0 - 40.0 mg/dL   LDL Cholesterol 106 (H) 0 - 99 mg/dL   Total CHOL/HDL Ratio 4    NonHDL 116.43   TSH  Result Value Ref Range   TSH 2.87 0.35 - 5.50 uIU/mL  VITAMIN D 25 Hydroxy (Vit-D Deficiency, Fractures)  Result Value Ref Range   VITD 52.82 30.00 - 100.00 ng/mL

## 2021-02-07 NOTE — Patient Instructions (Addendum)
It was great to see you again today, I will be in touch with your labs are soon as possible  If not done already, I would recommend getting the updated COVID booster this fall  Please go by the imaging dept at [facility] imaging and get lumbar films this week Assuming your urine culture is negative we will plan for urology referral and MRI of your lumbar spine  Try the Myrbetriq for urinary incontinence- let me know if not covered by your insurance

## 2021-02-10 ENCOUNTER — Ambulatory Visit (INDEPENDENT_AMBULATORY_CARE_PROVIDER_SITE_OTHER): Payer: Managed Care, Other (non HMO) | Admitting: Family Medicine

## 2021-02-10 ENCOUNTER — Other Ambulatory Visit: Payer: Self-pay

## 2021-02-10 ENCOUNTER — Encounter: Payer: Self-pay | Admitting: Family Medicine

## 2021-02-10 VITALS — BP 110/72 | HR 97 | Temp 97.9°F | Resp 18 | Ht 64.0 in | Wt 168.2 lb

## 2021-02-10 DIAGNOSIS — R7303 Prediabetes: Secondary | ICD-10-CM

## 2021-02-10 DIAGNOSIS — Z13220 Encounter for screening for lipoid disorders: Secondary | ICD-10-CM | POA: Diagnosis not present

## 2021-02-10 DIAGNOSIS — Z0000 Encounter for general adult medical examination without abnormal findings: Secondary | ICD-10-CM

## 2021-02-10 DIAGNOSIS — N3942 Incontinence without sensory awareness: Secondary | ICD-10-CM

## 2021-02-10 DIAGNOSIS — Z1329 Encounter for screening for other suspected endocrine disorder: Secondary | ICD-10-CM

## 2021-02-10 DIAGNOSIS — R5383 Other fatigue: Secondary | ICD-10-CM

## 2021-02-10 DIAGNOSIS — Z130 Encounter for screening for diseases of the blood and blood-forming organs and certain disorders involving the immune mechanism: Secondary | ICD-10-CM | POA: Diagnosis not present

## 2021-02-10 LAB — COMPREHENSIVE METABOLIC PANEL
ALT: 30 U/L (ref 0–35)
AST: 19 U/L (ref 0–37)
Albumin: 4.4 g/dL (ref 3.5–5.2)
Alkaline Phosphatase: 59 U/L (ref 39–117)
BUN: 13 mg/dL (ref 6–23)
CO2: 29 mEq/L (ref 19–32)
Calcium: 9.1 mg/dL (ref 8.4–10.5)
Chloride: 101 mEq/L (ref 96–112)
Creatinine, Ser: 0.73 mg/dL (ref 0.40–1.20)
GFR: 98.57 mL/min (ref >60.00)
Glucose, Bld: 90 mg/dL (ref 70–99)
Potassium: 3.8 mEq/L (ref 3.5–5.1)
Sodium: 139 mEq/L (ref 135–145)
Total Bilirubin: 0.4 mg/dL (ref 0.2–1.2)
Total Protein: 6.5 g/dL (ref 6.0–8.3)

## 2021-02-10 LAB — CBC
HCT: 40.4 % (ref 36.0–46.0)
Hemoglobin: 13.5 g/dL (ref 12.0–15.0)
MCHC: 33.6 g/dL (ref 30.0–36.0)
MCV: 94.4 fl (ref 78.0–100.0)
Platelets: 221 10*3/uL (ref 150.0–400.0)
RBC: 4.28 Mil/uL (ref 3.87–5.11)
RDW: 13.3 % (ref 11.5–15.5)
WBC: 6.2 10*3/uL (ref 4.0–10.5)

## 2021-02-10 LAB — TSH: TSH: 2.87 u[IU]/mL (ref 0.35–5.50)

## 2021-02-10 LAB — LIPID PANEL
Cholesterol: 156 mg/dL (ref 0–200)
HDL: 39.3 mg/dL (ref >39.00)
LDL Cholesterol: 106 mg/dL — ABNORMAL HIGH (ref 0–99)
NonHDL: 116.43
Total CHOL/HDL Ratio: 4
Triglycerides: 54 mg/dL (ref 0.0–149.0)
VLDL: 10.8 mg/dL (ref 0.0–40.0)

## 2021-02-10 LAB — HEMOGLOBIN A1C: Hgb A1c MFr Bld: 5.7 % (ref 4.6–6.5)

## 2021-02-10 LAB — VITAMIN D 25 HYDROXY (VIT D DEFICIENCY, FRACTURES): VITD: 52.82 ng/mL (ref 30.00–100.00)

## 2021-02-10 MED ORDER — MIRABEGRON ER 25 MG PO TB24: 25.0000 mg | ORAL_TABLET | Freq: Every day | ORAL | 5 refills | Status: DC

## 2021-02-12 ENCOUNTER — Encounter: Payer: Self-pay | Admitting: Family Medicine

## 2021-02-12 DIAGNOSIS — N3942 Incontinence without sensory awareness: Secondary | ICD-10-CM

## 2021-02-13 ENCOUNTER — Ambulatory Visit
Admission: RE | Admit: 2021-02-13 | Discharge: 2021-02-13 | Disposition: A | Discharge status: Home / Self Care | Payer: Managed Care, Other (non HMO) | Source: Ambulatory Visit | Attending: Family Medicine | Admitting: Family Medicine

## 2021-02-13 ENCOUNTER — Other Ambulatory Visit: Payer: Self-pay

## 2021-02-13 ENCOUNTER — Encounter: Payer: Self-pay | Admitting: Family Medicine

## 2021-02-13 DIAGNOSIS — N3942 Incontinence without sensory awareness: Secondary | ICD-10-CM

## 2021-02-13 LAB — URINE CULTURE
MICRO NUMBER:: 12422108
Result:: NO GROWTH
SPECIMEN QUALITY:: ADEQUATE

## 2021-02-14 NOTE — Addendum Note (Signed)
Addended by: Lamar Blinks C on: 02/14/2021 02:55 PM   Modules accepted: Orders

## 2021-02-15 ENCOUNTER — Encounter: Payer: Self-pay | Admitting: Family Medicine

## 2021-02-15 ENCOUNTER — Other Ambulatory Visit: Payer: Self-pay | Admitting: Family Medicine

## 2021-02-15 DIAGNOSIS — R202 Paresthesia of skin: Secondary | ICD-10-CM

## 2021-02-15 DIAGNOSIS — N3942 Incontinence without sensory awareness: Secondary | ICD-10-CM

## 2021-02-18 ENCOUNTER — Telehealth: Payer: Self-pay | Admitting: Family Medicine

## 2021-02-18 NOTE — Telephone Encounter (Signed)
[facility] needs the last office notes and urinary labs for the urology referral. It can be faxed to [phone].

## 2021-02-19 ENCOUNTER — Other Ambulatory Visit: Payer: Self-pay

## 2021-02-19 ENCOUNTER — Ambulatory Visit
Admission: RE | Admit: 2021-02-19 | Discharge: 2021-02-19 | Disposition: A | Discharge status: Home / Self Care | Payer: Managed Care, Other (non HMO) | Source: Ambulatory Visit

## 2021-02-19 DIAGNOSIS — Z1231 Encounter for screening mammogram for malignant neoplasm of breast: Secondary | ICD-10-CM

## 2021-02-25 ENCOUNTER — Encounter: Payer: Self-pay | Admitting: Family Medicine

## 2021-02-27 ENCOUNTER — Encounter: Payer: Self-pay | Admitting: Family Medicine

## 2022-01-12 ENCOUNTER — Other Ambulatory Visit: Payer: Self-pay | Admitting: Family Medicine

## 2022-01-12 DIAGNOSIS — Z1231 Encounter for screening mammogram for malignant neoplasm of breast: Secondary | ICD-10-CM

## 2022-01-14 DIAGNOSIS — Z1231 Encounter for screening mammogram for malignant neoplasm of breast: Secondary | ICD-10-CM

## 2022-03-03 DIAGNOSIS — Z1231 Encounter for screening mammogram for malignant neoplasm of breast: Secondary | ICD-10-CM

## 2022-03-12 ENCOUNTER — Other Ambulatory Visit: Payer: Self-pay | Admitting: Family Medicine

## 2022-03-12 DIAGNOSIS — Z1231 Encounter for screening mammogram for malignant neoplasm of breast: Secondary | ICD-10-CM

## 2022-03-18 ENCOUNTER — Ambulatory Visit
Admission: RE | Admit: 2022-03-18 | Discharge: 2022-03-18 | Disposition: A | Discharge status: Home / Self Care | Payer: Managed Care, Other (non HMO) | Source: Ambulatory Visit

## 2022-03-18 DIAGNOSIS — Z1231 Encounter for screening mammogram for malignant neoplasm of breast: Secondary | ICD-10-CM

## 2022-03-29 NOTE — Progress Notes (Unsigned)
[facility] [address], [address] [place], [place] [address] [phone]  Date:  04/02/2022   Name:  Wendy Tate   DOB:  [birth year]   MRN:  [id number]  PCP:  Pearline Cables, MD    Chief Complaint: No chief complaint on file.   History of Present Illness:  Wendy Tate is a 46 y.o. very pleasant female patient who presents with the following:  Patient seen today for physical exam-history of prediabetes Most recent visit with myself September 2022  She has GYN care per Dr. Normand Sloop, status post negative work-up for postmenopausal bleeding   Recommend COVID-19 booster Flu vaccine Pap is up-to-date Cologuard up-to-date  Taking Myrbetriq 25  Patient Active Problem List   Diagnosis Date Noted   Anaphylaxis 02/28/2020   Prediabetes 01/02/2020   Bilateral carpal tunnel syndrome 12/30/2016   Obesity 03/18/2016   ASCUS (atypical squamous cells of undetermined significance) on Pap smear 02/16/2012   Hx LEEP (loop electrosurgical excision procedure 02/16/2012    Past Medical History:  Diagnosis Date   Abnormal Pap smear 04/2007, 04/2009, 07/2011   ASCUS    Chicken pox    Seasonal allergies     Past Surgical History:  Procedure Laterality Date   ADENOIDECTOMY     APPENDECTOMY     DILATATION & CURETTAGE/HYSTEROSCOPY WITH TRUECLEAR N/A 01/18/2013   Procedure: DILATATION & CURETTAGE/HYSTEROSCOPY WITH TRUECLEAR;  Surgeon: Michael Litter, MD;  Location: [facility];  Service: Gynecology;  Laterality: N/A;  D&C Hysteroscopy with TruClear   LEEP     TONSILLECTOMY     WISDOM TOOTH EXTRACTION      Social History   Tobacco Use   Smoking status: Light Smoker    Types: Cigarettes    Last attempt to quit: 08/04/2011    Years since quitting: 10.6   Smokeless tobacco: Never   Tobacco comments:    Socially  Vaping Use   Vaping Use: Never used  Substance Use Topics   Alcohol use: Yes    Alcohol/week: 1.0 standard  drink of alcohol    Types: 1 Standard drinks or equivalent per week    Comment: socially   Drug use: No    Comment: hx.    Family History  Problem Relation Age of Onset   Heart disease Mother    Diabetes Mother    Heart disease Father    Leukemia Father        died at age 74   Arthritis Maternal Grandmother    Stroke Maternal Grandmother    Diabetes Maternal Grandmother    Hodgkin's lymphoma Maternal Uncle     No Known Allergies  Medication list has been reviewed and updated.  Current Outpatient Medications on File Prior to Visit  Medication Sig Dispense Refill   Biotin w/ Vitamins C & E (HAIR/SKIN/NAILS PO) Take by mouth.     Cholecalciferol (VITAMIN D3 PO) Take 2,000 Int'l Units by mouth daily.     mirabegron ER (MYRBETRIQ) 25 MG TB24 tablet Take 1 tablet (25 mg total) by mouth daily. 30 tablet 5   No current facility-administered medications on file prior to visit.    Review of Systems:  As per HPI- otherwise negative.   Physical Examination: There were no vitals filed for this visit. There were no vitals filed for this visit. There is no height or weight on file to calculate BMI. Ideal Body Weight:    GEN: no acute distress.  HEENT: Atraumatic, Normocephalic.  Ears and Nose: No external deformity. CV: RRR, No M/G/R. No JVD. No thrill. No extra heart sounds. PULM: CTA B, no wheezes, crackles, rhonchi. No retractions. No resp. distress. No accessory muscle use. ABD: S, NT, ND, +BS. No rebound. No HSM. EXTR: No c/c/e PSYCH: Normally interactive. Conversant.    Assessment and Plan: *** Physical exam today.  Encouraged healthy diet and exercise routine Will plan further follow- up pending labs.  Signed Abbe Amsterdam, MD

## 2022-03-29 NOTE — Patient Instructions (Incomplete)
It was good to see again today, I will be in touch with your labs soon as possible  We will get a heart monitor and coronary calcium for you, and set up for a cardiology consultation with Dr Duke Salvia  Let me know if anything is changing or getting worse in the meantime   I recommend the latest covid booster and a flu shot!

## 2022-04-02 ENCOUNTER — Telehealth (HOSPITAL_BASED_OUTPATIENT_CLINIC_OR_DEPARTMENT_OTHER): Payer: Self-pay

## 2022-04-02 ENCOUNTER — Ambulatory Visit: Payer: Managed Care, Other (non HMO) | Attending: Family Medicine

## 2022-04-02 ENCOUNTER — Ambulatory Visit (INDEPENDENT_AMBULATORY_CARE_PROVIDER_SITE_OTHER): Payer: Managed Care, Other (non HMO) | Admitting: Family Medicine

## 2022-04-02 ENCOUNTER — Encounter: Payer: Self-pay | Admitting: Family Medicine

## 2022-04-02 VITALS — BP 110/60 | HR 80 | Temp 97.6°F | Resp 18 | Ht 64.0 in | Wt 182.4 lb

## 2022-04-02 DIAGNOSIS — R5383 Other fatigue: Secondary | ICD-10-CM | POA: Diagnosis not present

## 2022-04-02 DIAGNOSIS — Z13220 Encounter for screening for lipoid disorders: Secondary | ICD-10-CM

## 2022-04-02 DIAGNOSIS — R7303 Prediabetes: Secondary | ICD-10-CM

## 2022-04-02 DIAGNOSIS — R002 Palpitations: Secondary | ICD-10-CM | POA: Diagnosis not present

## 2022-04-02 DIAGNOSIS — N920 Excessive and frequent menstruation with regular cycle: Secondary | ICD-10-CM | POA: Diagnosis not present

## 2022-04-02 DIAGNOSIS — Z0000 Encounter for general adult medical examination without abnormal findings: Secondary | ICD-10-CM

## 2022-04-02 DIAGNOSIS — Z1329 Encounter for screening for other suspected endocrine disorder: Secondary | ICD-10-CM | POA: Diagnosis not present

## 2022-04-02 DIAGNOSIS — Z130 Encounter for screening for diseases of the blood and blood-forming organs and certain disorders involving the immune mechanism: Secondary | ICD-10-CM

## 2022-04-02 DIAGNOSIS — R6889 Other general symptoms and signs: Secondary | ICD-10-CM

## 2022-04-02 LAB — LIPID PANEL
Cholesterol: 188 mg/dL (ref 0–200)
HDL: 47.7 mg/dL (ref >39.00)
LDL Cholesterol: 130 mg/dL — ABNORMAL HIGH (ref 0–99)
NonHDL: 140.35
Total CHOL/HDL Ratio: 4
Triglycerides: 52 mg/dL (ref 0.0–149.0)
VLDL: 10.4 mg/dL (ref 0.0–40.0)

## 2022-04-02 LAB — COMPREHENSIVE METABOLIC PANEL
ALT: 14 U/L (ref 0–35)
AST: 14 U/L (ref 0–37)
Albumin: 4.5 g/dL (ref 3.5–5.2)
Alkaline Phosphatase: 65 U/L (ref 39–117)
BUN: 12 mg/dL (ref 6–23)
CO2: 30 mEq/L (ref 19–32)
Calcium: 9.4 mg/dL (ref 8.4–10.5)
Chloride: 104 mEq/L (ref 96–112)
Creatinine, Ser: 0.77 mg/dL (ref 0.40–1.20)
GFR: 91.72 mL/min (ref >60.00)
Glucose, Bld: 93 mg/dL (ref 70–99)
Potassium: 4.2 mEq/L (ref 3.5–5.1)
Sodium: 139 mEq/L (ref 135–145)
Total Bilirubin: 0.4 mg/dL (ref 0.2–1.2)
Total Protein: 6.6 g/dL (ref 6.0–8.3)

## 2022-04-02 LAB — HEMOGLOBIN A1C: Hgb A1c MFr Bld: 5.6 % (ref 4.6–6.5)

## 2022-04-02 LAB — CBC
HCT: 43.2 % (ref 36.0–46.0)
Hemoglobin: 14.6 g/dL (ref 12.0–15.0)
MCHC: 33.9 g/dL (ref 30.0–36.0)
MCV: 93.9 fl (ref 78.0–100.0)
Platelets: 264 10*3/uL (ref 150.0–400.0)
RBC: 4.6 Mil/uL (ref 3.87–5.11)
RDW: 12.4 % (ref 11.5–15.5)
WBC: 4.1 10*3/uL (ref 4.0–10.5)

## 2022-04-02 LAB — FERRITIN: Ferritin: 29.9 ng/mL (ref 10.0–291.0)

## 2022-04-02 LAB — TSH: TSH: 1.93 u[IU]/mL (ref 0.35–5.50)

## 2022-04-02 LAB — VITAMIN D 25 HYDROXY (VIT D DEFICIENCY, FRACTURES): VITD: 39.68 ng/mL (ref 30.00–100.00)

## 2022-04-02 NOTE — Progress Notes (Unsigned)
Enrolled for [employer] to mail a ZIO XT long term holter monitor to the patients address on file.   DOD to read. 

## 2022-04-05 DIAGNOSIS — R002 Palpitations: Secondary | ICD-10-CM | POA: Diagnosis not present

## 2022-04-05 DIAGNOSIS — R6889 Other general symptoms and signs: Secondary | ICD-10-CM | POA: Diagnosis not present

## 2022-04-15 ENCOUNTER — Encounter: Payer: Self-pay | Admitting: Family Medicine

## 2022-04-15 ENCOUNTER — Ambulatory Visit (HOSPITAL_BASED_OUTPATIENT_CLINIC_OR_DEPARTMENT_OTHER)
Admission: RE | Admit: 2022-04-15 | Discharge: 2022-04-15 | Disposition: A | Discharge status: Home / Self Care | Payer: Managed Care, Other (non HMO) | Source: Ambulatory Visit | Attending: Family Medicine | Admitting: Family Medicine

## 2022-04-15 DIAGNOSIS — R6889 Other general symptoms and signs: Secondary | ICD-10-CM | POA: Insufficient documentation

## 2022-04-15 DIAGNOSIS — R931 Abnormal findings on diagnostic imaging of heart and coronary circulation: Secondary | ICD-10-CM

## 2022-04-17 MED ORDER — ROSUVASTATIN CALCIUM 20 MG PO TABS: 20.0000 mg | ORAL_TABLET | Freq: Every day | ORAL | 3 refills | Status: DC

## 2022-04-28 ENCOUNTER — Encounter: Payer: Self-pay | Admitting: Family Medicine

## 2022-04-28 MED ORDER — NIRMATRELVIR/RITONAVIR (PAXLOVID)TABLET: 3.0000 | ORAL_TABLET | Freq: Two times a day (BID) | ORAL | 0 refills | Status: AC

## 2022-04-28 NOTE — Telephone Encounter (Signed)
Please see the MyChart message reply(ies) for my assessment and plan.  The patient gave consent for this Medical Advice Message and is aware that it may result in a bill to their insurance company as well as the possibility that this may result in a co-payment or deductible. They are an established patient, but are not seeking medical advice exclusively about a problem treated during an in person or video visit in the last 7 days. I did not recommend an in person or video visit within 7 days of my reply.  I spent a total of 10 minutes cumulative time within 7 days through MyChart messaging Raykwon Hobbs, MD  

## 2022-04-28 NOTE — Addendum Note (Signed)
Addended by: Abbe Amsterdam C on: 04/28/2022 01:23 PM   Modules accepted: Orders

## 2022-05-05 ENCOUNTER — Other Ambulatory Visit: Payer: Self-pay

## 2022-05-05 DIAGNOSIS — R931 Abnormal findings on diagnostic imaging of heart and coronary circulation: Secondary | ICD-10-CM

## 2022-05-05 MED ORDER — ROSUVASTATIN CALCIUM 20 MG PO TABS: 20.0000 mg | ORAL_TABLET | Freq: Every day | ORAL | 3 refills | Status: DC

## 2022-06-01 ENCOUNTER — Telehealth (HOSPITAL_BASED_OUTPATIENT_CLINIC_OR_DEPARTMENT_OTHER): Payer: Self-pay

## 2022-06-01 NOTE — Telephone Encounter (Signed)
Patient returned call and transferred from call center. Patient states she is feeling okay, but still feeling her palpitations. She states that her heart will race occasionally and then she has a lot of fatigue. She states she has never had any chest pain.  Advised patient to keep appointment as scheduled and to call us if something changes.

## 2022-06-01 NOTE — Telephone Encounter (Addendum)
Left message for patient to call back      ----- Message from Loel Dubonnet, NP sent at 06/01/2022  8:32 AM EST ----- Hi!   Pt has new pt appt with Dr. Oval Linsey upcoming. PCP ordered calcium score which showed CAD. Can we call her to see how she is feeling?   If she is still experiencing chest pain or exertional dyspnea we'll plan to set her up for stress test. If no acute symptoms, can wait til her appt with Dr. Oval Linsey. If she wants to proceed with stress test let me know and I will call to explain procedure.   TY!

## 2022-06-01 NOTE — Telephone Encounter (Signed)
Agree with plan for follow up as scheduled. Can consider myoview at that time as clinically indicated.   Loel Dubonnet, NP

## 2022-06-16 ENCOUNTER — Ambulatory Visit (HOSPITAL_BASED_OUTPATIENT_CLINIC_OR_DEPARTMENT_OTHER): Payer: Managed Care, Other (non HMO) | Admitting: Cardiovascular Disease

## 2022-06-16 ENCOUNTER — Encounter (HOSPITAL_BASED_OUTPATIENT_CLINIC_OR_DEPARTMENT_OTHER): Payer: Self-pay | Admitting: Cardiovascular Disease

## 2022-06-16 VITALS — BP 118/80 | HR 86 | Ht 64.0 in | Wt 188.7 lb

## 2022-06-16 DIAGNOSIS — I200 Unstable angina: Secondary | ICD-10-CM

## 2022-06-16 DIAGNOSIS — I2510 Atherosclerotic heart disease of native coronary artery without angina pectoris: Secondary | ICD-10-CM | POA: Diagnosis not present

## 2022-06-16 DIAGNOSIS — R002 Palpitations: Secondary | ICD-10-CM | POA: Diagnosis not present

## 2022-06-16 HISTORY — DX: Atherosclerotic heart disease of native coronary artery without angina pectoris: I25.10

## 2022-06-16 HISTORY — DX: Palpitations: R00.2

## 2022-06-16 NOTE — H&P (View-Only) (Signed)
Cardiology Office Note:    Date:  06/16/2022   ID:  Wendy Tate, DOB 1976/03/10, MRN [id number]  PCP:  Darreld Mclean, MD   [facility] Providers Cardiologist:  None     Referring MD: Darreld Mclean, MD   No chief complaint on file.   History of Present Illness:    Wendy Tate is a 47 y.o. female with a hx of CAD, prediabetes, here for elevated calcium score. She was seen by Dr. Lorelei Pont 04/02/2022 and reported palpitations and SOB for about 6 months. She wore a monitor 04/2022 showing predominantly NSR and no significant arrhythmias. She had a coronary CT 04/15/2022 revealing a coronary calcium score of 268 (99th percentile). She was referred to cardiology for further evaluation.  Today, she is accompanied by her wife. She complains of palpitations with onset over 1 year ago. Typically they are random while at rest, but consistently occur with activity. Additionally she notes that while leaning forwards, it feels like her heart is lying on her chest wall. Sometimes her palpitations are fast, and sometimes she has harder heart beats. Especially while bending over, she may have associated lightheadedness. Her palpitations are also associated with dyspnea that she describes as "feeling like her breath is sucked through her nose." Of note, she also endorses a lot of stress in her life. Beyond yard work, she does not participate in much formal exercise. During jury duty this past May, she took 4 flights of stairs one day and was exhausted with very rapid palpitations. For about a month she has been taking rosuvastatin. Generally she has followed a healthy diet since June 2022, when she started the  [employer] and lost 35 lbs.  Still healthy since thenShe is a former smoker. From 1996 to 2013 she was smoking about 8-10 cigarettes daily. She denies any chest pain, or peripheral edema. No headaches, syncope, orthopnea, or PND.   Past Medical History:  Diagnosis Date   Abnormal  Pap smear 04/2007, 04/2009, 07/2011   ASCUS    CAD in native artery 06/16/2022   Chicken pox    Palpitations 06/16/2022   Seasonal allergies     Past Surgical History:  Procedure Laterality Date   ADENOIDECTOMY     APPENDECTOMY     DILATATION & CURETTAGE/HYSTEROSCOPY WITH TRUECLEAR N/A 01/18/2013   Procedure: DILATATION & CURETTAGE/HYSTEROSCOPY WITH TRUECLEAR;  Surgeon: Betsy Coder, MD;  Location: [facility];  Service: Gynecology;  Laterality: N/A;  D&C Hysteroscopy with TruClear   LEEP     TONSILLECTOMY     WISDOM TOOTH EXTRACTION      Current Medications: Current Meds  Medication Sig   Ascorbic Acid (VITAMIN C PO) Take by mouth.   Biotin w/ Vitamins C & E (HAIR/SKIN/NAILS PO) Take by mouth.   Cholecalciferol (VITAMIN D3 PO) Take 2,000 Int'l Units by mouth daily.   Omega-3 Fatty Acids (FISH OIL) 1000 MG CAPS Take 1,000 mg by mouth daily.   rosuvastatin (CRESTOR) 20 MG tablet Take 1 tablet (20 mg total) by mouth daily.   Turmeric 500 MG CAPS Take 1 capsule by mouth daily.   vitamin B-12 (CYANOCOBALAMIN) 100 MCG tablet Take 100 mcg by mouth daily.     Allergies:   Patient has no known allergies.   Social History   Socioeconomic History   Marital status: Married    Spouse name: Not on file   Number of children: 0   Years of education: Not on file   Highest education  level: Some college, no degree  Occupational History   Not on file  Tobacco Use   Smoking status: Light Smoker    Types: Cigarettes    Last attempt to quit: 08/04/2011    Years since quitting: 10.8   Smokeless tobacco: Never   Tobacco comments:    Socially  Vaping Use   Vaping Use: Never used  Substance and Sexual Activity   Alcohol use: Yes    Alcohol/week: 1.0 standard drink of alcohol    Types: 1 Standard drinks or equivalent per week    Comment: socially   Drug use: No    Comment: hx.   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   Highest level of education:  Some college   Lives with wife.  No children.  One story home.   Social Determinants of Health   Financial Resource Strain: Not on file  Food Insecurity: Not on file  Transportation Needs: Not on file  Physical Activity: Not on file  Stress: Not on file  Social Connections: Not on file     Family History: The patient's family history includes Arthritis in her maternal grandmother; Diabetes in her maternal grandmother and mother; Heart attack in her father and mother; Heart disease in her father, maternal grandmother, and mother; Hodgkin's lymphoma in her maternal uncle; Leukemia in her father; Stroke in her maternal grandmother.  ROS:   Please see the history of present illness.    (+) Palpitations (+) Dyspnea (+) Lightheadedness (+) Stress All other systems reviewed and are negative.  EKGs/Labs/Other Studies Reviewed:    The following studies were reviewed today:  Monitor 04/2022:   Normal sinus rhythm with average HR 83 bpm   No significant arrthythmias   Patch Wear Time:  6 days and 17 hours (2023-11-19T19:19:38-0500 to 2023-11-26T12:37:56-0500)   Patient had a min HR of 46 bpm, max HR of 160 bpm, and avg HR of 83 bpm. Predominant underlying rhythm was Sinus Rhythm. Slight P wave morphology changes were noted. Isolated SVEs were rare (<1.0%), and no SVE Couplets or SVE Triplets were present.  Isolated VEs were rare (<1.0%), and no VE Couplets or VE Triplets were present.  CT Cardiac Scoring 04/15/2022: FINDINGS: Coronary arteries: Normal origins.   Coronary Calcium Score:   Left main: 0Left anterior descending artery: 226   Left circumflex artery: 38   Right coronary artery: 4   Total: 268   Percentile: 99   Pericardium: Normal.   Ascending Aorta: Normal caliber.   Non-cardiac: See separate report from [facility]  Radiology.   IMPRESSION: Coronary calcium score of 268. This was 38 percentile for age-, race-, and sex-matched controls.  EKG:   EKG is personally reviewed. 06/16/2022: EKG was not ordered.  Recent Labs: 04/02/2022: ALT 14; BUN 12; Creatinine, Ser 0.77; Hemoglobin 14.6; Platelets 264.0; Potassium 4.2; Sodium 139; TSH 1.93   Recent Lipid Panel    Component Value Date/Time   CHOL 188 04/02/2022 0956   TRIG 52.0 04/02/2022 0956   HDL 47.70 04/02/2022 0956   CHOLHDL 4 04/02/2022 0956   VLDL 10.4 04/02/2022 0956   LDLCALC 130 (H) 04/02/2022 0956     Risk Assessment/Calculations:           Physical Exam:    Wt Readings from Last 3 Encounters:  06/16/22 188 lb 11.2 oz (85.6 kg)  04/02/22 182 lb 6.4 oz (82.7 kg)  02/10/21 168 lb 3.2 oz (76.3 kg)     VS:  BP 118/80 (BP Location: Left Arm, Patient Position: Sitting, Cuff Size: Large)   Pulse 86   Ht 5\' 4"  (1.626 m)   Wt 188 lb 11.2 oz (85.6 kg)   SpO2 100%   BMI 32.39 kg/m  , BMI Body mass index is 32.39 kg/m. GENERAL:  Well appearing HEENT: Pupils equal round and reactive, fundi not visualized, oral mucosa unremarkable NECK:  No jugular venous distention, waveform within normal limits, carotid upstroke brisk and symmetric, no bruits, no thyromegaly LUNGS:  Clear to auscultation bilaterally HEART:  RRR.  PMI not displaced or sustained,S1 and S2 within normal limits, no S3, no S4, no clicks, no rubs, no murmurs ABD:  Flat, positive bowel sounds normal in frequency in pitch, no bruits, no rebound, no guarding, no midline pulsatile mass, no hepatomegaly, no splenomegaly EXT:  2 plus pulses throughout, no edema, no cyanosis no clubbing SKIN:  No rashes no nodules NEURO:  Cranial nerves II through XII grossly intact, motor grossly intact throughout PSYCH:  Cognitively intact, oriented to person place and time   ASSESSMENT:    1. Palpitations   2. CAD in native artery   3. Unstable angina (HCC)    PLAN:     Palpitations She has frequent palpitations with leaning forward and when exerting herself.  Monitor showed rare PACs and episodes of sinus tachycardia while at rest.  Thyroid function and blood counts have been normal.  It seems as though she has some inappropriate sinus tachycardia discussed starting a nodal agent and she declined.  CAD in native artery She had a coronary calcium score that was 268.  This was 99th percentile for age, race and gender.  She has also had some exertional symptoms that are concerning for atypical angina.  We discussed her options including coronary CTA and cardiac catheterization.  She prefers to pursue cardiac catheterization.  Continue rosuvastatin which was recently started.  Start aspirin 81 mg daily.   Shared Decision Making/Informed Consent The risks [stroke (1 in 1000), death (1 in 1000), kidney failure [usually temporary] (1 in 500), bleeding (1 in 200), allergic reaction [possibly serious] (1 in 200)], benefits (diagnostic support and management of coronary artery disease) and alternatives of a cardiac catheterization were discussed in detail with Ms. Philson and she is willing to proceed.   Disposition: FU with Alohilani Levenhagen C. Oval Linsey, MD, [facility] in 1-2 months.  Medication Adjustments/Labs and Tests Ordered: Current medicines are reviewed at length with the patient today.  Concerns regarding medicines are outlined above.   Orders Placed This Encounter  Procedures   Basic metabolic panel   CBC   ECHOCARDIOGRAM COMPLETE   No orders of the defined types were placed in this encounter.  Patient Instructions  Medication Instructions:  Your Physician recommend you continue on your current medication as directed.    *If you need a refill on your cardiac medications before your next appointment, please call your pharmacy*   Lab Work: Your physician recommends that you return for lab work today- BMP, CBC  If you have labs (blood work) drawn today and your  tests are completely normal, you will receive your results only by: MyChart Message (if you have MyChart) OR A paper copy in the mail If you have any lab test that is abnormal or we need to change your treatment, we will  call you to review the results.   Testing/Procedures:  You are scheduled for a Cardiac Catheterization on Monday, February 5 with Dr. Glenetta Hew.  1. Please arrive at the [facility] (Main Entrance A) at [facility]: [address] [place], [place] [address] at 5:30 AM (This time is two hours before your procedure to ensure your preparation). Free valet parking service is available.   Special note: Every effort is made to have your procedure done on time. Please understand that emergencies sometimes delay scheduled procedures.  2. Diet: Do not eat solid foods after midnight.  The patient may have clear liquids until 5am upon the day of the procedure.  3. Labs: done at OV 1/30  4. Medication instructions in preparation for your procedure:  On the morning of your procedure, take your Aspirin 81 mg and any morning medicines NOT listed above.  You may use sips of water.  5. Plan for one night stay--bring personal belongings. 6. Bring a current list of your medications and current insurance cards. 7. You MUST have a responsible person to drive you home. 8. Someone MUST be with you the first 24 hours after you arrive home or your discharge will be delayed. 9. Please wear clothes that are easy to get on and off and wear slip-on shoes.  Thank you for allowing [place] to care for you!   -- [facility]  Your physician has requested that you have an echocardiogram. Echocardiography is a painless test that uses sound waves to create images of your heart. It provides your doctor with information about the size and shape of your heart and how well your heart's chambers and valves are working. This procedure takes approximately one hour. There  are no restrictions for this procedure. Please do NOT wear cologne, perfume, aftershave, or lotions (deodorant is allowed). Please arrive 15 minutes prior to your appointment time.   Follow-Up: At [facility], you and your health needs are our priority.  As part of our continuing mission to provide you with exceptional heart care, we have created designated Provider Care Teams.  These Care Teams include your primary Cardiologist (physician) and Advanced Practice Providers (APPs -  Physician Assistants and Nurse Practitioners) who all work together to provide you with the care you need, when you need it.  We recommend signing up for the patient portal called "MyChart".  Sign up information is provided on this After Visit Summary.  MyChart is used to connect with patients for Virtual Visits (Telemedicine).  Patients are able to view lab/test results, encounter notes, upcoming appointments, etc.  Non-urgent messages can be sent to your provider as well.   To learn more about what you can do with MyChart, go to [email or web address].    Your next appointment:   1-2 month(s)  Provider:   Skeet Latch, MD or Laurann Montana, NP      [facility] Stumpf,acting as a scribe for Skeet Latch, MD.,have documented all relevant documentation on the behalf of Skeet Latch, MD,as directed by  Skeet Latch, MD while in the presence of Skeet Latch, MD.  I, [place] Oval Linsey, MD have reviewed all documentation for this visit.  The documentation of the exam, diagnosis, procedures, and orders on 06/16/2022 are all accurate and complete.   Signed, Skeet Latch, MD  06/16/2022 5:29 PM    [facility]

## 2022-06-16 NOTE — Progress Notes (Signed)
Cardiology Office Note:    Date:  06/16/2022   ID:  Wendy Tate, DOB 1976/03/10, MRN [id number]  PCP:  Darreld Mclean, MD   [facility] Providers Cardiologist:  None     Referring MD: Darreld Mclean, MD   No chief complaint on file.   History of Present Illness:    Wendy Tate is a 47 y.o. female with a hx of CAD, prediabetes, here for elevated calcium score. She was seen by Dr. Lorelei Pont 04/02/2022 and reported palpitations and SOB for about 6 months. She wore a monitor 04/2022 showing predominantly NSR and no significant arrhythmias. She had a coronary CT 04/15/2022 revealing a coronary calcium score of 268 (99th percentile). She was referred to cardiology for further evaluation.  Today, she is accompanied by her wife. She complains of palpitations with onset over 1 year ago. Typically they are random while at rest, but consistently occur with activity. Additionally she notes that while leaning forwards, it feels like her heart is lying on her chest wall. Sometimes her palpitations are fast, and sometimes she has harder heart beats. Especially while bending over, she may have associated lightheadedness. Her palpitations are also associated with dyspnea that she describes as "feeling like her breath is sucked through her nose." Of note, she also endorses a lot of stress in her life. Beyond yard work, she does not participate in much formal exercise. During jury duty this past May, she took 4 flights of stairs one day and was exhausted with very rapid palpitations. For about a month she has been taking rosuvastatin. Generally she has followed a healthy diet since June 2022, when she started the  [employer] and lost 35 lbs.  Still healthy since thenShe is a former smoker. From 1996 to 2013 she was smoking about 8-10 cigarettes daily. She denies any chest pain, or peripheral edema. No headaches, syncope, orthopnea, or PND.   Past Medical History:  Diagnosis Date   Abnormal  Pap smear 04/2007, 04/2009, 07/2011   ASCUS    CAD in native artery 06/16/2022   Chicken pox    Palpitations 06/16/2022   Seasonal allergies     Past Surgical History:  Procedure Laterality Date   ADENOIDECTOMY     APPENDECTOMY     DILATATION & CURETTAGE/HYSTEROSCOPY WITH TRUECLEAR N/A 01/18/2013   Procedure: DILATATION & CURETTAGE/HYSTEROSCOPY WITH TRUECLEAR;  Surgeon: Betsy Coder, MD;  Location: [facility];  Service: Gynecology;  Laterality: N/A;  D&C Hysteroscopy with TruClear   LEEP     TONSILLECTOMY     WISDOM TOOTH EXTRACTION      Current Medications: Current Meds  Medication Sig   Ascorbic Acid (VITAMIN C PO) Take by mouth.   Biotin w/ Vitamins C & E (HAIR/SKIN/NAILS PO) Take by mouth.   Cholecalciferol (VITAMIN D3 PO) Take 2,000 Int'l Units by mouth daily.   Omega-3 Fatty Acids (FISH OIL) 1000 MG CAPS Take 1,000 mg by mouth daily.   rosuvastatin (CRESTOR) 20 MG tablet Take 1 tablet (20 mg total) by mouth daily.   Turmeric 500 MG CAPS Take 1 capsule by mouth daily.   vitamin B-12 (CYANOCOBALAMIN) 100 MCG tablet Take 100 mcg by mouth daily.     Allergies:   Patient has no known allergies.   Social History   Socioeconomic History   Marital status: Married    Spouse name: Not on file   Number of children: 0   Years of education: Not on file   Highest education  level: Some college, no degree  Occupational History   Not on file  Tobacco Use   Smoking status: Light Smoker    Types: Cigarettes    Last attempt to quit: 08/04/2011    Years since quitting: 10.8   Smokeless tobacco: Never   Tobacco comments:    Socially  Vaping Use   Vaping Use: Never used  Substance and Sexual Activity   Alcohol use: Yes    Alcohol/week: 1.0 standard drink of alcohol    Types: 1 Standard drinks or equivalent per week    Comment: socially   Drug use: No    Comment: hx.   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   Highest level of education:  Some college   Lives with wife.  No children.  One story home.   Social Determinants of Health   Financial Resource Strain: Not on file  Food Insecurity: Not on file  Transportation Needs: Not on file  Physical Activity: Not on file  Stress: Not on file  Social Connections: Not on file     Family History: The patient's family history includes Arthritis in her maternal grandmother; Diabetes in her maternal grandmother and mother; Heart attack in her father and mother; Heart disease in her father, maternal grandmother, and mother; Hodgkin's lymphoma in her maternal uncle; Leukemia in her father; Stroke in her maternal grandmother.  ROS:   Please see the history of present illness.    (+) Palpitations (+) Dyspnea (+) Lightheadedness (+) Stress All other systems reviewed and are negative.  EKGs/Labs/Other Studies Reviewed:    The following studies were reviewed today:  Monitor 04/2022:   Normal sinus rhythm with average HR 83 bpm   No significant arrthythmias   Patch Wear Time:  6 days and 17 hours (2023-11-19T19:19:38-0500 to 2023-11-26T12:37:56-0500)   Patient had a min HR of 46 bpm, max HR of 160 bpm, and avg HR of 83 bpm. Predominant underlying rhythm was Sinus Rhythm. Slight P wave morphology changes were noted. Isolated SVEs were rare (<1.0%), and no SVE Couplets or SVE Triplets were present.  Isolated VEs were rare (<1.0%), and no VE Couplets or VE Triplets were present.  CT Cardiac Scoring 04/15/2022: FINDINGS: Coronary arteries: Normal origins.   Coronary Calcium Score:   Left main: 0Left anterior descending artery: 226   Left circumflex artery: 38   Right coronary artery: 4   Total: 268   Percentile: 99   Pericardium: Normal.   Ascending Aorta: Normal caliber.   Non-cardiac: See separate report from [facility].   IMPRESSION: Coronary calcium score of 268. This was 99 percentile for age-, race-, and sex-matched controls.  EKG:   EKG is personally reviewed. 06/16/2022: EKG was not ordered.  Recent Labs: 04/02/2022: ALT 14; BUN 12; Creatinine, Ser 0.77; Hemoglobin 14.6; Platelets 264.0; Potassium 4.2; Sodium 139; TSH 1.93   Recent Lipid Panel    Component Value Date/Time   CHOL 188 04/02/2022 0956   TRIG 52.0 04/02/2022 0956   HDL 47.70 04/02/2022 0956   CHOLHDL 4 04/02/2022 0956   VLDL 10.4 04/02/2022 0956   LDLCALC 130 (H) 04/02/2022 0956     Risk Assessment/Calculations:           Physical Exam:    Wt Readings from Last 3 Encounters:    06/16/22 188 lb 11.2 oz (85.6 kg)  04/02/22 182 lb 6.4 oz (82.7 kg)  02/10/21 168 lb 3.2 oz (76.3 kg)     VS:  BP 118/80 (BP Location: Left Arm, Patient Position: Sitting, Cuff Size: Large)   Pulse 86   Ht 5\' 4"  (1.626 m)   Wt 188 lb 11.2 oz (85.6 kg)   SpO2 100%   BMI 32.39 kg/m  , BMI Body mass index is 32.39 kg/m. GENERAL:  Well appearing HEENT: Pupils equal round and reactive, fundi not visualized, oral mucosa unremarkable NECK:  No jugular venous distention, waveform within normal limits, carotid upstroke brisk and symmetric, no bruits, no thyromegaly LUNGS:  Clear to auscultation bilaterally HEART:  RRR.  PMI not displaced or sustained,S1 and S2 within normal limits, no S3, no S4, no clicks, no rubs, no murmurs ABD:  Flat, positive bowel sounds normal in frequency in pitch, no bruits, no rebound, no guarding, no midline pulsatile mass, no hepatomegaly, no splenomegaly EXT:  2 plus pulses throughout, no edema, no cyanosis no clubbing SKIN:  No rashes no nodules NEURO:  Cranial nerves II through XII grossly intact, motor grossly intact throughout PSYCH:  Cognitively intact, oriented to person place and time   ASSESSMENT:    1. Palpitations   2. CAD in native artery   3. Unstable angina (HCC)    PLAN:     Palpitations She has frequent palpitations with leaning forward and when exerting herself.  Monitor showed rare PACs and episodes of sinus tachycardia while at rest.  Thyroid function and blood counts have been normal.  It seems as though she has some inappropriate sinus tachycardia discussed starting a nodal agent and she declined.  CAD in native artery She had a coronary calcium score that was 268.  This was 99th percentile for age, race and gender.  She has also had some exertional symptoms that are concerning for atypical angina.  We discussed her options including coronary CTA and cardiac catheterization.  She prefers to pursue cardiac catheterization.  Continue rosuvastatin which was recently started.  Start aspirin 81 mg daily.   Shared Decision Making/Informed Consent The risks [stroke (1 in 1000), death (1 in 1000), kidney failure [usually temporary] (1 in 500), bleeding (1 in 200), allergic reaction [possibly serious] (1 in 200)], benefits (diagnostic support and management of coronary artery disease) and alternatives of a cardiac catheterization were discussed in detail with Ms. Philson and she is willing to proceed.   Disposition: FU with Kenzey Birkland C. Oval Linsey, MD, [facility] in 1-2 months.  Medication Adjustments/Labs and Tests Ordered: Current medicines are reviewed at length with the patient today.  Concerns regarding medicines are outlined above.   Orders Placed This Encounter  Procedures   Basic metabolic panel   CBC   ECHOCARDIOGRAM COMPLETE   No orders of the defined types were placed in this encounter.  Patient Instructions  Medication Instructions:  Your Physician recommend you continue on your current medication as directed.    *If you need a refill on your cardiac medications before your next appointment, please call your pharmacy*   Lab Work: Your physician recommends that you return for lab work today- BMP, CBC  If you have labs (blood work) drawn today and your  tests are completely normal, you will receive your results only by: MyChart Message (if you have MyChart) OR A paper copy in the mail If you have any lab test that is abnormal or we need to change your treatment, we will  call you to review the results.   Testing/Procedures:  You are scheduled for a Cardiac Catheterization on Monday, February 5 with Dr. Glenetta Hew.  1. Please arrive at the [facility] (Main Entrance A) at [facility]: [address] [place], [place] [address] at 5:30 AM (This time is two hours before your procedure to ensure your preparation). Free valet parking service is available.   Special note: Every effort is made to have your procedure done on time. Please understand that emergencies sometimes delay scheduled procedures.  2. Diet: Do not eat solid foods after midnight.  The patient may have clear liquids until 5am upon the day of the procedure.  3. Labs: done at OV 1/30  4. Medication instructions in preparation for your procedure:  On the morning of your procedure, take your Aspirin 81 mg and any morning medicines NOT listed above.  You may use sips of water.  5. Plan for one night stay--bring personal belongings. 6. Bring a current list of your medications and current insurance cards. 7. You MUST have a responsible person to drive you home. 8. Someone MUST be with you the first 24 hours after you arrive home or your discharge will be delayed. 9. Please wear clothes that are easy to get on and off and wear slip-on shoes.  Thank you for allowing [place] to care for you!   -- [facility]  Your physician has requested that you have an echocardiogram. Echocardiography is a painless test that uses sound waves to create images of your heart. It provides your doctor with information about the size and shape of your heart and how well your heart's chambers and valves are working. This procedure takes approximately one hour. There  are no restrictions for this procedure. Please do NOT wear cologne, perfume, aftershave, or lotions (deodorant is allowed). Please arrive 15 minutes prior to your appointment time.   Follow-Up: At [facility], you and your health needs are our priority.  As part of our continuing mission to provide you with exceptional heart care, we have created designated Provider Care Teams.  These Care Teams include your primary Cardiologist (physician) and Advanced Practice Providers (APPs -  Physician Assistants and Nurse Practitioners) who all work together to provide you with the care you need, when you need it.  We recommend signing up for the patient portal called "MyChart".  Sign up information is provided on this After Visit Summary.  MyChart is used to connect with patients for Virtual Visits (Telemedicine).  Patients are able to view lab/test results, encounter notes, upcoming appointments, etc.  Non-urgent messages can be sent to your provider as well.   To learn more about what you can do with MyChart, go to [email or web address].    Your next appointment:   1-2 month(s)  Provider:   Skeet Latch, MD or Laurann Montana, NP      [facility] Stumpf,acting as a scribe for Skeet Latch, MD.,have documented all relevant documentation on the behalf of Skeet Latch, MD,as directed by  Skeet Latch, MD while in the presence of Skeet Latch, MD.  I, [place] Oval Linsey, MD have reviewed all documentation for this visit.  The documentation of the exam, diagnosis, procedures, and orders on 06/16/2022 are all accurate and complete.   Signed, Skeet Latch, MD  06/16/2022 5:29 PM    [facility]

## 2022-06-16 NOTE — Patient Instructions (Signed)
Medication Instructions:  Your Physician recommend you continue on your current medication as directed.    *If you need a refill on your cardiac medications before your next appointment, please call your pharmacy*   Lab Work: Your physician recommends that you return for lab work today- BMP, CBC  If you have labs (blood work) drawn today and your tests are completely normal, you will receive your results only by: MyChart Message (if you have MyChart) OR A paper copy in the mail If you have any lab test that is abnormal or we need to change your treatment, we will call you to review the results.   Testing/Procedures:  You are scheduled for a Cardiac Catheterization on Monday, February 5 with Dr. Glenetta Hew.  1. Please arrive at the [facility] (Main Entrance A) at [facility]: [address] [place], [place] [address] at 5:30 AM (This time is two hours before your procedure to ensure your preparation). Free valet parking service is available.   Special note: Every effort is made to have your procedure done on time. Please understand that emergencies sometimes delay scheduled procedures.  2. Diet: Do not eat solid foods after midnight.  The patient may have clear liquids until 5am upon the day of the procedure.  3. Labs: done at OV 1/30  4. Medication instructions in preparation for your procedure:  On the morning of your procedure, take your Aspirin 81 mg and any morning medicines NOT listed above.  You may use sips of water.  5. Plan for one night stay--bring personal belongings. 6. Bring a current list of your medications and current insurance cards. 7. You MUST have a responsible person to drive you home. 8. Someone MUST be with you the first 24 hours after you arrive home or your discharge will be delayed. 9. Please wear clothes that are easy to get on and off and wear slip-on shoes.  Thank you for allowing [place] to care for you!   -- [facility]  Your physician has requested that you have an echocardiogram. Echocardiography is a painless test that uses sound waves to create images of your heart. It provides your doctor with information about the size and shape of your heart and how well your heart's chambers and valves are working. This procedure takes approximately one hour. There are no restrictions for this procedure. Please do NOT wear cologne, perfume, aftershave, or lotions (deodorant is allowed). Please arrive 15 minutes prior to your appointment time.   Follow-Up: At [facility], you and your health needs are our priority.  As part of our continuing mission to provide you with exceptional heart care, we have created designated Provider Care Teams.  These Care Teams include your primary Cardiologist (physician) and Advanced Practice Providers (APPs -  Physician Assistants and Nurse Practitioners) who all work together to provide you with the care you need, when you need it.  We recommend signing up for the patient portal called "MyChart".  Sign up information is provided on this After Visit Summary.  MyChart is used to connect with patients for Virtual Visits (Telemedicine).  Patients are able to view lab/test results, encounter notes, upcoming appointments, etc.  Non-urgent messages can be sent to your provider as well.   To learn more about what you can do with MyChart, go to [email or web address].    Your next appointment:   1-2 month(s)  Provider:   Skeet Latch, MD or Laurann Montana, NP

## 2022-06-16 NOTE — Assessment & Plan Note (Addendum)
She has frequent palpitations with leaning forward and when exerting herself.  Monitor showed rare PACs and episodes of sinus tachycardia while at rest.  Thyroid function and blood counts have been normal.  It seems as though she has some inappropriate sinus tachycardia discussed starting a nodal agent and she declined.

## 2022-06-16 NOTE — Assessment & Plan Note (Addendum)
She had a coronary calcium score that was 268.  This was 99th percentile for age, race and gender.  She has also had some exertional symptoms that are concerning for atypical angina.  We discussed her options including coronary CTA and cardiac catheterization.  She prefers to pursue cardiac catheterization.  Continue rosuvastatin which was recently started.  Start aspirin 81 mg daily.

## 2022-06-17 ENCOUNTER — Telehealth (HOSPITAL_BASED_OUTPATIENT_CLINIC_OR_DEPARTMENT_OTHER): Payer: Self-pay | Admitting: *Deleted

## 2022-06-17 LAB — BASIC METABOLIC PANEL
BUN/Creatinine Ratio: 21 (ref 9–23)
BUN: 15 mg/dL (ref 6–24)
CO2: 22 mmol/L (ref 20–29)
Calcium: 9.8 mg/dL (ref 8.7–10.2)
Chloride: 104 mmol/L (ref 96–106)
Creatinine, Ser: 0.72 mg/dL (ref 0.57–1.00)
Glucose: 94 mg/dL (ref 70–99)
Potassium: 4.7 mmol/L (ref 3.5–5.2)
Sodium: 140 mmol/L (ref 134–144)
eGFR: 104 mL/min/{1.73_m2} (ref >59)

## 2022-06-17 LAB — CBC
Hematocrit: 41.8 % (ref 34.0–46.6)
Hemoglobin: 14.2 g/dL (ref 11.1–15.9)
MCH: 31.3 pg (ref 26.6–33.0)
MCHC: 34 g/dL (ref 31.5–35.7)
MCV: 92 fL (ref 79–97)
Platelets: 253 10*3/uL (ref 150–450)
RBC: 4.54 x10E6/uL (ref 3.77–5.28)
RDW: 12.5 % (ref 11.7–15.4)
WBC: 6 10*3/uL (ref 3.4–10.8)

## 2022-06-17 NOTE — Telephone Encounter (Signed)
Advised patient, verbalized understanding  

## 2022-06-17 NOTE — Telephone Encounter (Signed)
Patient in for visit yesterday and scheduled for cath  Needs to start ASA 81 mg daily, advised would call her back to confrim Left message to call back

## 2022-06-18 ENCOUNTER — Telehealth: Payer: Self-pay | Admitting: *Deleted

## 2022-06-18 NOTE — Telephone Encounter (Addendum)
Cardiac Catheterization scheduled at [facility] for: Monday June 22, 2022 7:30 AM Arrival time and place: [facility] Entrance A at: 5:30 AM  Nothing to eat after midnight prior to procedure, clear liquids until 5 AM day of procedure.  Medication instructions: -Usual morning medications can be taken with sips of water including aspirin 81 mg.  Confirmed patient has responsible adult to drive home post procedure and be with patient first 24 hours after arriving home.  Patient reports no new symptoms concerning for COVID-19 in the past 10 days.  Reviewed procedure instructions with patient.

## 2022-06-22 ENCOUNTER — Other Ambulatory Visit (HOSPITAL_COMMUNITY): Payer: Self-pay

## 2022-06-22 ENCOUNTER — Other Ambulatory Visit: Payer: Self-pay

## 2022-06-22 ENCOUNTER — Ambulatory Visit (HOSPITAL_COMMUNITY)
Admission: RE | Admit: 2022-06-22 | Discharge: 2022-06-22 | Disposition: A | Discharge status: Home / Self Care | Payer: Managed Care, Other (non HMO) | Attending: Cardiology | Admitting: Cardiology

## 2022-06-22 ENCOUNTER — Encounter (HOSPITAL_COMMUNITY)
Admission: RE | Disposition: A | Discharge status: Home / Self Care | Payer: Self-pay | Source: Home / Self Care | Attending: Cardiology

## 2022-06-22 DIAGNOSIS — R002 Palpitations: Secondary | ICD-10-CM | POA: Insufficient documentation

## 2022-06-22 DIAGNOSIS — Z955 Presence of coronary angioplasty implant and graft: Secondary | ICD-10-CM

## 2022-06-22 DIAGNOSIS — Z8249 Family history of ischemic heart disease and other diseases of the circulatory system: Secondary | ICD-10-CM | POA: Diagnosis not present

## 2022-06-22 DIAGNOSIS — I25110 Atherosclerotic heart disease of native coronary artery with unstable angina pectoris: Secondary | ICD-10-CM | POA: Diagnosis not present

## 2022-06-22 DIAGNOSIS — I2510 Atherosclerotic heart disease of native coronary artery without angina pectoris: Secondary | ICD-10-CM

## 2022-06-22 DIAGNOSIS — R7303 Prediabetes: Secondary | ICD-10-CM | POA: Insufficient documentation

## 2022-06-22 DIAGNOSIS — Z79899 Other long term (current) drug therapy: Secondary | ICD-10-CM | POA: Insufficient documentation

## 2022-06-22 DIAGNOSIS — Z01818 Encounter for other preprocedural examination: Secondary | ICD-10-CM

## 2022-06-22 DIAGNOSIS — F17210 Nicotine dependence, cigarettes, uncomplicated: Secondary | ICD-10-CM | POA: Insufficient documentation

## 2022-06-22 DIAGNOSIS — I2584 Coronary atherosclerosis due to calcified coronary lesion: Secondary | ICD-10-CM | POA: Insufficient documentation

## 2022-06-22 DIAGNOSIS — I200 Unstable angina: Secondary | ICD-10-CM | POA: Diagnosis present

## 2022-06-22 DIAGNOSIS — E785 Hyperlipidemia, unspecified: Secondary | ICD-10-CM | POA: Insufficient documentation

## 2022-06-22 DIAGNOSIS — R0609 Other forms of dyspnea: Secondary | ICD-10-CM | POA: Insufficient documentation

## 2022-06-22 HISTORY — PX: CORONARY IMAGING/OCT: CATH118326

## 2022-06-22 HISTORY — PX: CORONARY STENT INTERVENTION: CATH118234

## 2022-06-22 HISTORY — PX: LEFT HEART CATH AND CORONARY ANGIOGRAPHY: CATH118249

## 2022-06-22 LAB — PREGNANCY, URINE: Preg Test, Ur: NEGATIVE

## 2022-06-22 LAB — POCT ACTIVATED CLOTTING TIME
Activated Clotting Time: 244 seconds
Activated Clotting Time: 277 seconds

## 2022-06-22 SURGERY — LEFT HEART CATH AND CORONARY ANGIOGRAPHY
Anesthesia: LOCAL

## 2022-06-22 MED ORDER — FENTANYL CITRATE (PF) 100 MCG/2ML IJ SOLN
INTRAMUSCULAR | Status: AC
  Filled 2022-06-22: qty 2

## 2022-06-22 MED ORDER — VERAPAMIL HCL 2.5 MG/ML IV SOLN
INTRAVENOUS | Status: DC | PRN
  Administered 2022-06-22: 10 mL via INTRA_ARTERIAL

## 2022-06-22 MED ORDER — MIDAZOLAM HCL 2 MG/2ML IJ SOLN
INTRAMUSCULAR | Status: DC | PRN
  Administered 2022-06-22 (×2): 1 mg via INTRAVENOUS

## 2022-06-22 MED ORDER — LIDOCAINE HCL (PF) 1 % IJ SOLN
INTRAMUSCULAR | Status: AC
  Filled 2022-06-22: qty 30

## 2022-06-22 MED ORDER — CLOPIDOGREL BISULFATE 300 MG PO TABS
ORAL_TABLET | ORAL | Status: DC | PRN
  Administered 2022-06-22 (×2): 300 mg via ORAL

## 2022-06-22 MED ORDER — METOPROLOL SUCCINATE ER 25 MG PO TB24: 12.5000 mg | ORAL_TABLET | Freq: Every day | ORAL | 1 refills | Status: DC

## 2022-06-22 MED ORDER — SODIUM CHLORIDE 0.9% FLUSH: 3.0000 mL | Freq: Two times a day (BID) | INTRAVENOUS | Status: DC

## 2022-06-22 MED ORDER — ASPIRIN 81 MG PO TBEC: 81.0000 mg | DELAYED_RELEASE_TABLET | Freq: Every day | ORAL | Status: AC

## 2022-06-22 MED ORDER — SODIUM CHLORIDE 0.9 % WEIGHT BASED INFUSION
1.0000 mL/kg/h | INTRAVENOUS | Status: DC
  Administered 2022-06-22: 1 mL/kg/h via INTRAVENOUS

## 2022-06-22 MED ORDER — METOPROLOL SUCCINATE 12.5 MG HALF TABLET
12.5000 mg | ORAL_TABLET | Freq: Every day | ORAL | Status: DC
  Filled 2022-06-22: qty 1

## 2022-06-22 MED ORDER — DIPHENHYDRAMINE HCL 50 MG/ML IJ SOLN
INTRAMUSCULAR | Status: DC | PRN
  Administered 2022-06-22: 25 mg via INTRAVENOUS

## 2022-06-22 MED ORDER — HEPARIN SODIUM (PORCINE) 1000 UNIT/ML IJ SOLN
INTRAMUSCULAR | Status: AC
  Filled 2022-06-22: qty 10

## 2022-06-22 MED ORDER — ASPIRIN 81 MG PO CHEW: 81.0000 mg | CHEWABLE_TABLET | ORAL | Status: DC

## 2022-06-22 MED ORDER — NITROGLYCERIN 1 MG/10 ML FOR IR/CATH LAB
INTRA_ARTERIAL | Status: AC
  Filled 2022-06-22: qty 10

## 2022-06-22 MED ORDER — SODIUM CHLORIDE 0.9% FLUSH: 3.0000 mL | INTRAVENOUS | Status: DC | PRN

## 2022-06-22 MED ORDER — MIDAZOLAM HCL 2 MG/2ML IJ SOLN
INTRAMUSCULAR | Status: AC
  Filled 2022-06-22: qty 2

## 2022-06-22 MED ORDER — CLOPIDOGREL BISULFATE 75 MG PO TABS: 75.0000 mg | ORAL_TABLET | Freq: Every day | ORAL | Status: DC

## 2022-06-22 MED ORDER — VERAPAMIL HCL 2.5 MG/ML IV SOLN
INTRAVENOUS | Status: AC
  Filled 2022-06-22: qty 2

## 2022-06-22 MED ORDER — HEPARIN (PORCINE) IN NACL 1000-0.9 UT/500ML-% IV SOLN
INTRAVENOUS | Status: DC | PRN
  Administered 2022-06-22 (×2): 500 mL

## 2022-06-22 MED ORDER — HEPARIN (PORCINE) IN NACL 1000-0.9 UT/500ML-% IV SOLN
INTRAVENOUS | Status: AC
  Filled 2022-06-22: qty 1000

## 2022-06-22 MED ORDER — MORPHINE SULFATE (PF) 2 MG/ML IV SOLN: 2.0000 mg | INTRAVENOUS | Status: DC | PRN

## 2022-06-22 MED ORDER — LIDOCAINE HCL (PF) 1 % IJ SOLN
INTRAMUSCULAR | Status: DC | PRN
  Administered 2022-06-22: 5 mL
  Administered 2022-06-22: 12 mL

## 2022-06-22 MED ORDER — ONDANSETRON HCL 4 MG/2ML IJ SOLN: 4.0000 mg | Freq: Four times a day (QID) | INTRAMUSCULAR | Status: DC | PRN

## 2022-06-22 MED ORDER — CLOPIDOGREL BISULFATE 75 MG PO TABS: 75.0000 mg | ORAL_TABLET | Freq: Every day | ORAL | 3 refills | Status: DC

## 2022-06-22 MED ORDER — SODIUM CHLORIDE 0.9 % WEIGHT BASED INFUSION
3.0000 mL/kg/h | INTRAVENOUS | Status: AC
  Administered 2022-06-22: 3 mL/kg/h via INTRAVENOUS

## 2022-06-22 MED ORDER — CLOPIDOGREL BISULFATE 300 MG PO TABS
ORAL_TABLET | ORAL | Status: AC
  Filled 2022-06-22: qty 1

## 2022-06-22 MED ORDER — LABETALOL HCL 5 MG/ML IV SOLN: 10.0000 mg | INTRAVENOUS | Status: AC | PRN

## 2022-06-22 MED ORDER — HYDRALAZINE HCL 20 MG/ML IJ SOLN: 10.0000 mg | INTRAMUSCULAR | Status: AC | PRN

## 2022-06-22 MED ORDER — SODIUM CHLORIDE 0.9 % WEIGHT BASED INFUSION: 1.0000 mL/kg/h | INTRAVENOUS | Status: DC

## 2022-06-22 MED ORDER — FAMOTIDINE IN NACL 20-0.9 MG/50ML-% IV SOLN
INTRAVENOUS | Status: DC | PRN
  Administered 2022-06-22: 20 mg via INTRAVENOUS

## 2022-06-22 MED ORDER — ASPIRIN 81 MG PO TBEC: 81.0000 mg | DELAYED_RELEASE_TABLET | Freq: Every day | ORAL | 0 refills | Status: DC

## 2022-06-22 MED ORDER — DIPHENHYDRAMINE HCL 50 MG/ML IJ SOLN
INTRAMUSCULAR | Status: AC
  Filled 2022-06-22: qty 1

## 2022-06-22 MED ORDER — CLOPIDOGREL BISULFATE 75 MG PO TABS: 75.0000 mg | ORAL_TABLET | Freq: Every day | ORAL | 0 refills | Status: DC

## 2022-06-22 MED ORDER — CLOPIDOGREL BISULFATE 75 MG PO TABS
75.0000 mg | ORAL_TABLET | Freq: Every day | ORAL | 3 refills | Status: DC
  Filled 2022-06-22: qty 30, 30d supply, fill #0

## 2022-06-22 MED ORDER — FAMOTIDINE IN NACL 20-0.9 MG/50ML-% IV SOLN
INTRAVENOUS | Status: AC
  Filled 2022-06-22: qty 50

## 2022-06-22 MED ORDER — SODIUM CHLORIDE 0.9 % IV SOLN: 250.0000 mL | INTRAVENOUS | Status: DC | PRN

## 2022-06-22 MED ORDER — HEPARIN SODIUM (PORCINE) 1000 UNIT/ML IJ SOLN
INTRAMUSCULAR | Status: DC | PRN
  Administered 2022-06-22: 2000 [IU] via INTRAVENOUS
  Administered 2022-06-22: 3000 [IU] via INTRAVENOUS
  Administered 2022-06-22: 8000 [IU] via INTRAVENOUS

## 2022-06-22 MED ORDER — FENTANYL CITRATE (PF) 100 MCG/2ML IJ SOLN
INTRAMUSCULAR | Status: DC | PRN
  Administered 2022-06-22 (×3): 25 ug via INTRAVENOUS

## 2022-06-22 MED ORDER — METOPROLOL SUCCINATE ER 25 MG PO TB24
12.5000 mg | ORAL_TABLET | Freq: Every day | ORAL | 1 refills | Status: DC
  Filled 2022-06-22: qty 15, 30d supply, fill #0

## 2022-06-22 MED ORDER — ACETAMINOPHEN 325 MG PO TABS: 650.0000 mg | ORAL_TABLET | ORAL | Status: DC | PRN

## 2022-06-22 MED ORDER — SODIUM CHLORIDE 0.9 % IV SOLN
INTRAVENOUS | Status: DC | PRN
  Administered 2022-06-22: 250 mL via INTRAVENOUS

## 2022-06-22 MED ORDER — NITROGLYCERIN 1 MG/10 ML FOR IR/CATH LAB
INTRA_ARTERIAL | Status: DC | PRN
  Administered 2022-06-22 (×2): 100 ug via INTRACORONARY

## 2022-06-22 SURGICAL SUPPLY — 29 items
BALL SAPPHIRE NC24 3.0X12 (BALLOONS) ×1
BALLN EMERGE MR 2.5X15 (BALLOONS) ×1
BALLOON EMERGE MR 2.5X15 (BALLOONS) IMPLANT
BALLOON SAPPHIRE NC24 3.0X12 (BALLOONS) IMPLANT
CATH 5FR JL3.5 JR4 ANG PIG MP (CATHETERS) IMPLANT
CATH DRAGONFLY OPSTAR (CATHETERS) IMPLANT
CATH INFINITI 5FR JL4 (CATHETERS) IMPLANT
CATH VISTA GUIDE 6FR XBLAD3.5 (CATHETERS) IMPLANT
CLOSURE MYNX CONTROL 6F/7F (Vascular Products) IMPLANT
DEVICE RAD COMP TR BAND LRG (VASCULAR PRODUCTS) IMPLANT
GLIDESHEATH SLEND SS 6F .021 (SHEATH) IMPLANT
GUIDEWIRE ANGLED .035X150CM (WIRE) IMPLANT
GUIDEWIRE INQWIRE 1.5J.035X260 (WIRE) IMPLANT
GUIDEWIRE PRESSURE X 175 (WIRE) IMPLANT
INQWIRE 1.5J .035X260CM (WIRE) ×1
KIT ENCORE 26 ADVANTAGE (KITS) IMPLANT
KIT ESSENTIALS PG (KITS) IMPLANT
KIT HEART LEFT (KITS) ×1 IMPLANT
KIT MICROPUNCTURE NIT STIFF (SHEATH) IMPLANT
PACK CARDIAC CATHETERIZATION (CUSTOM PROCEDURE TRAY) ×1 IMPLANT
SHEATH PINNACLE 5F 10CM (SHEATH) IMPLANT
SHEATH PINNACLE 6F 10CM (SHEATH) IMPLANT
SHEATH PROBE COVER 6X72 (BAG) IMPLANT
STENT SYNERGY XD 2.75X16 (Permanent Stent) IMPLANT
SYNERGY XD 2.75X16 (Permanent Stent) ×1 IMPLANT
TRANSDUCER W/STOPCOCK (MISCELLANEOUS) ×1 IMPLANT
TUBING CIL FLEX 10 FLL-RA (TUBING) ×1 IMPLANT
WIRE EMERALD 3MM-J .035X150CM (WIRE) IMPLANT
WIRE HI TORQ VERSACORE-J 145CM (WIRE) IMPLANT

## 2022-06-22 NOTE — Progress Notes (Addendum)
Pt educated on site care, exercise guidelines, heart healthy nutrition, importance of antiplatelet medication and CRP II. Pt understood and had no questions. Pt referred to [facility] CRP. Pt did state that she would prefer CR not call her. She will call CR if she decides to participate.  [place], BSRT

## 2022-06-22 NOTE — Discharge Instructions (Addendum)
Drink plenty of fluids for 48 hours and keep wrist elevated at heart level for 24 hours  Radial Site Care   This sheet gives you information about how to care for yourself after your procedure. Your health care provider may also give you more specific instructions. If you have problems or questions, contact your health care provider. What can I expect after the procedure? After the procedure, it is common to have: Bruising and tenderness at the catheter insertion area. Follow these instructions at home: Medicines Take over-the-counter and prescription medicines only as told by your health care provider. Insertion site care Follow instructions from your health care provider about how to take care of your insertion site. Make sure you: Wash your hands with soap and water before you change your bandage (dressing). If soap and water are not available, use hand sanitizer. Remove your dressing as told by your health care provider. In 24 hours Check your insertion site every day for signs of infection. Check for: Redness, swelling, or pain. Fluid or blood. Pus or a bad smell. Warmth. Do not take baths, swim, or use a hot tub until your health care provider approves. You may shower 24-48 hours after the procedure, or as directed by your health care provider. Remove the dressing and gently wash the site with plain soap and water. Pat the area dry with a clean towel. Do not rub the site. That could cause bleeding. Do not apply powder or lotion to the site. Activity   For 24 hours after the procedure, or as directed by your health care provider: Do not flex or bend the affected arm. Do not push or pull heavy objects with the affected arm. Do not drive yourself home from the hospital or clinic. You may drive 24 hours after the procedure unless your health care provider tells you not to. Do not operate machinery or power tools. Do not lift anything that is heavier than 10 lb (4.5 kg), or the  limit that you are told, until your health care provider says that it is safe.  For 4 days Ask your health care provider when it is okay to: Return to work or school. Resume usual physical activities or sports. Resume sexual activity. General instructions If the catheter site starts to bleed, raise your arm and put firm pressure on the site. If the bleeding does not stop, get help right away. This is a medical emergency. If you went home on the same day as your procedure, a responsible adult should be with you for the first 24 hours after you arrive home. Keep all follow-up visits as told by your health care provider. This is important. Contact a health care provider if: You have a fever. You have redness, swelling, or yellow drainage around your insertion site. Get help right away if: You have unusual pain at the radial site. The catheter insertion area swells very fast. The insertion area is bleeding, and the bleeding does not stop when you hold steady pressure on the area. Your arm or hand becomes pale, cool, tingly, or numb. These symptoms may represent a serious problem that is an emergency. Do not wait to see if the symptoms will go away. Get medical help right away. Call your local emergency services (911 in the [place] Do not drive yourself to the hospital. Summary After the procedure, it is common to have bruising and tenderness at the site. Follow instructions from your health care provider about how to take care of your   radial site wound. Check the wound every day for signs of infection. Do not lift anything that is heavier than 10 lb (4.5 kg), or the limit that you are told, until your health care provider says that it is safe. This information is not intended to replace advice given to you by your health care provider. Make sure you discuss any questions you have with your health care provider. Document Revised: 06/09/2017 Document Reviewed: 06/09/2017 Elsevier Patient Education   2020 [place]. Femoral Site Care This sheet gives you information about how to care for yourself after your procedure. Your health care provider may also give you more specific instructions. If you have problems or questions, contact your health care provider. What can I expect after the procedure?  After the procedure, it is common to have: Bruising that usually fades within 1-2 weeks. Tenderness at the site. Follow these instructions at home: Wound care Follow instructions from your health care provider about how to take care of your insertion site. Make sure you: Wash your hands with soap and water before you change your bandage (dressing). If soap and water are not available, use hand sanitizer. Remove your dressing as told by your health care provider. In 24 hours Do not take baths, swim, or use a hot tub until your health care provider approves. You may shower 24-48 hours after the procedure or as told by your health care provider. Gently wash the site with plain soap and water. Pat the area dry with a clean towel. Do not rub the site. This may cause bleeding. Do not apply powder or lotion to the site. Keep the site clean and dry. Check your femoral site every day for signs of infection. Check for: Redness, swelling, or pain. Fluid or blood. Warmth. Pus or a bad smell. Activity For the first 2-3 days after your procedure, or as long as directed: Avoid climbing stairs as much as possible. Do not squat. Do not lift anything that is heavier than 10 lb (4.5 kg), or the limit that you are told, until your health care provider says that it is safe. For 5 days Rest as directed. Avoid sitting for a long time without moving. Get up to take short walks every 1-2 hours. Do not drive for 24 hours if you were given a medicine to help you relax (sedative). General instructions Take over-the-counter and prescription medicines only as told by your health care provider. Keep all follow-up  visits as told by your health care provider. This is important. Contact a health care provider if you have: A fever or chills. You have redness, swelling, or pain around your insertion site. Get help right away if: The catheter insertion area swells very fast. You pass out. You suddenly start to sweat or your skin gets clammy. The catheter insertion area is bleeding, and the bleeding does not stop when you hold steady pressure on the area. The area near or just beyond the catheter insertion site becomes pale, cool, tingly, or numb. These symptoms may represent a serious problem that is an emergency. Do not wait to see if the symptoms will go away. Get medical help right away. Call your local emergency services (911 in the [place] Do not drive yourself to the hospital. Summary After the procedure, it is common to have bruising that usually fades within 1-2 weeks. Check your femoral site every day for signs of infection. Do not lift anything that is heavier than 10 lb (4.5 kg), or the limit  that you are told, until your health care provider says that it is safe. This information is not intended to replace advice given to you by your health care provider. Make sure you discuss any questions you have with your health care provider. Document Revised: 05/17/2017 Document Reviewed: 05/17/2017 Elsevier Patient Education  2020 [place] about your medication: Plavix (anti-platelet agent)  Generic Name (Brand): clopidogrel (Plavix), once daily medication  PURPOSE: You are taking this medication along with aspirin to lower your chance of having a heart attack, stroke, or blood clots in your heart stent. These can be fatal. Plavix and aspirin help prevent platelets from sticking together and forming a clot that can block an artery or your stent.   Common SIDE EFFECTS you may experience include: bruising or bleeding more easily, shortness of breath  Do not stop taking PLAVIX  without talking to the doctor who prescribes it for you. People who are treated with a stent and stop taking Plavix too soon, have a higher risk of getting a blood clot in the stent, having a heart attack, or dying. If you stop Plavix because of bleeding, or for other reasons, your risk of a heart attack or stroke may increase.   Avoid taking NSAID agents or anti-inflammatory medications such as ibuprofen, naproxen given increased bleed risk with plavix - can use acetaminophen (Tylenol) if needed for pain.  Avoid taking over the counter stomach medications omeprazole (Prilosec) or esomeprazole (Nexium) since these do interact and make plavix less effective - ask your pharmacist or doctor for alterative agents if needed for heartburn or GERD.   Tell all of your doctors and dentists that you are taking Plavix. They should talk to the doctor who prescribed Plavix for you before you have any surgery or invasive procedure.   Contact your health care provider if you experience: severe or uncontrollable bleeding, pink/red/brown urine, vomiting blood or vomit that looks like "coffee grounds", red or black stools (looks like tar), coughing up blood or blood clots ----------------------------------------------------------------------------------------------------------------------

## 2022-06-22 NOTE — Discharge Summary (Signed)
Discharge Summary    Patient ID: Wendy Tate MRN: [id number]; DOB: 08/09/1975  Admit date: 06/22/2022 Discharge date: 06/22/2022  PCP:  Darreld Mclean, MD   [place] [facility] Cardiologist:  Skeet Latch, MD        Discharge Diagnoses    Principal Problem:   Unstable angina [facility]) Active Problems:   Prediabetes   CAD in native artery   Hyperlipidemia LDL goal <70    Diagnostic Studies/Procedures    Cath 06/22/2022   CULPRIT LESION: Prox LAD lesion is 85% stenosed.  RFR measurement 0.87-significant.   A drug-eluting stent SYNERGY XD 2.75X16 was placed-deployed to 2.9 mm and postdilated to 3.0 mm (OCT guided).  Post intervention, there is a 0% residual stenosis.   --------------------------------   Colon Flattery Cx to Prox Cx lesion is 15% stenosed.   --------------------------------   The left ventricular systolic function is normal.  The left ventricular ejection fraction is 55-65% by visual estimate. LV end diastolic pressure is normal.   There is no aortic valve stenosis.   POST-CATH DIAGNOSES Severe single-vessel disease with ostial and proximal LAD the 85% stenosis (RFR 0.87) ->  Successful DES PCI using Synergy XD 2.75 mm x 16 mm postdilated to 3.0 mm by OCT guidance. Lesion reduced to 0%, TIMI-3 flow pre and post. Otherwise minimal disease. Normal LVEF and LVEDP.      RECOMMENDATIONS Successful Mynx closure device Left Femoral access and TR band placement on radial access. Significant Right Radial Loop, if further catheterization is recommended would consider Right Ulnar Artery. DAPT per recommendations section If stable okay to discharge after 8 hours.     Diagnostic Dominance: Right  Intervention   _____________   History of Present Illness     Wendy Tate is a 47 y.o. female with CAD, prediabetes, here for elevated calcium score. She was seen by Dr. Lorelei Pont 04/02/2022 and reported palpitations and SOB for about 6 months. She wore  a monitor 04/2022 showing predominantly NSR and no significant arrhythmias. She had a coronary CT 04/15/2022 revealing a coronary calcium score of 268 (99th percentile). She was referred to cardiology for further evaluation.   Today, she is accompanied by her wife. She complains of palpitations with onset over 1 year ago. Typically they are random while at rest, but consistently occur with activity. Additionally she notes that while leaning forwards, it feels like her heart is lying on her chest wall. Sometimes her palpitations are fast, and sometimes she has harder heart beats. Especially while bending over, she may have associated lightheadedness. Her palpitations are also associated with dyspnea that she describes as "feeling like her breath is sucked through her nose." Of note, she also endorses a lot of stress in her life. Beyond yard work, she does not participate in much formal exercise. During jury duty this past May, she took 4 flights of stairs one day and was exhausted with very rapid palpitations. For about a month she has been taking rosuvastatin. Generally she has followed a healthy diet since June 2022, when she started the  [employer] and lost 35 lbs.  Still healthy since thenShe is a former smoker. From 1996 to 2013 she was smoking about 8-10 cigarettes daily. She denies any chest pain, or peripheral edema. No headaches, syncope, orthopnea, or PND.  Hospital Course     Consultants: N/A   Patient underwent scheduled cardiac catheterization on 06/22/2022 which revealed 80% proximal LAD lesion that was treated with 2.75 x 16 mm Synergy  DES, 15% ostial to proximal left circumflex residual, EF 55 to 65%.  Postprocedure she was placed on dual antiplatelet therapy with ASA and plavix. She was also placed on low dose toprol XL 12.5mg  daily. She does have significant right radial loop, if she requires cardiac catheterization in the future, it was recommended to consider right ulnar artery access.   She was able to undergo cardiac catheterization today via right femoral approach.  Post cath, she was placed on aspirin and Plavix.  Prescription for Plavix and Toprol-XL has been sent to [facility].  Her right femoral cath site has been reassessed around 3 PM prior to discharge, it was clean, dry, without significant hematoma.  Emphasis has been placed on compliance with dual antiplatelet therapy.  I also instructed the patient to avoid lifting anything greater than 5 pounds for 1 week.  Patient is deemed stable for same day discharge from the cardiac perspective.      Did the patient have an acute coronary syndrome (MI, NSTEMI, STEMI, etc) this admission?:  No                               Did the patient have a percutaneous coronary intervention (stent / angioplasty)?:  Yes.     Cath/PCI Registry Performance & Quality Measures: Aspirin prescribed? - Yes ADP Receptor Inhibitor (Plavix/Clopidogrel, Brilinta/Ticagrelor or Effient/Prasugrel) prescribed (includes medically managed patients)? - Yes High Intensity Statin (Lipitor 40-80mg  or Crestor 20-40mg ) prescribed? - Yes For EF <40%, was ACEI/ARB prescribed? - Not Applicable (EF >/= 40%) For EF <40%, Aldosterone Antagonist (Spironolactone or Eplerenone) prescribed? - Not Applicable (EF >/= 34%) Cardiac Rehab Phase II ordered? - Yes         _____________  Discharge Vitals Blood pressure 92/68, pulse (!) 55, temperature (!) 97.2 F (36.2 C), temperature source Temporal, resp. rate 13, height 5\' 4"  (1.626 m), weight 81.6 kg, SpO2 99 %.  Filed Weights   06/22/22 0610  Weight: 81.6 kg    Labs & Radiologic Studies    CBC No results for input(s): "WBC", "NEUTROABS", "HGB", "HCT", "MCV", "PLT" in the last 72 hours. Basic Metabolic Panel No results for input(s): "NA", "K", "CL", "CO2", "GLUCOSE", "BUN", "CREATININE", "CALCIUM", "MG", "PHOS" in the last 72 hours. Liver Function Tests No results for input(s): "AST", "ALT", "ALKPHOS",  "BILITOT", "PROT", "ALBUMIN" in the last 72 hours. No results for input(s): "LIPASE", "AMYLASE" in the last 72 hours. High Sensitivity Troponin:   No results for input(s): "TROPONINIHS" in the last 720 hours.  BNP Invalid input(s): "POCBNP" D-Dimer No results for input(s): "DDIMER" in the last 72 hours. Hemoglobin A1C No results for input(s): "HGBA1C" in the last 72 hours. Fasting Lipid Panel No results for input(s): "CHOL", "HDL", "LDLCALC", "TRIG", "CHOLHDL", "LDLDIRECT" in the last 72 hours. Thyroid Function Tests No results for input(s): "TSH", "T4TOTAL", "T3FREE", "THYROIDAB" in the last 72 hours.  Invalid input(s): "FREET3" _____________  CARDIAC CATHETERIZATION  Result Date: 06/22/2022   CULPRIT LESION: Prox LAD lesion is 85% stenosed.  RFR measurement 0.87-significant.   A drug-eluting stent SYNERGY XD 2.75X16 was placed-deployed to 2.9 mm and postdilated to 3.0 mm (OCT guided).  Post intervention, there is a 0% residual stenosis.   --------------------------------   Colon Flattery Cx to Prox Cx lesion is 15% stenosed.   --------------------------------   The left ventricular systolic function is normal.  The left ventricular ejection fraction is 55-65% by visual estimate. LV end diastolic pressure is  normal.   There is no aortic valve stenosis. POST-CATH DIAGNOSES Severe single-vessel disease with ostial and proximal LAD the 85% stenosis (RFR 0.87) -> Successful DES PCI using Synergy XD 2.75 mm x 16 mm postdilated to 3.0 mm by OCT guidance. Lesion reduced to 0%, TIMI-3 flow pre and post. Otherwise minimal disease. Normal LVEF and LVEDP. RECOMMENDATIONS Successful Mynx closure device Left Femoral access and TR band placement on radial access. Significant Right Radial Loop, if further catheterization is recommended would consider Right Ulnar Artery. DAPT per recommendations section If stable okay to discharge after 8 hours. Glenetta Hew, MD  Disposition   Pt is being discharged home today in good  condition.  Follow-up Plans & Appointments     Follow-up Information     Loel Dubonnet, NP Follow up on 07/02/2022.   Specialty: Cardiology Why: 2:20PM. Cardiology follow up Contact information: [address] [place] [address] [phone]                Discharge Instructions     Amb Referral to Cardiac Rehabilitation   Complete by: As directed    Diagnosis: Coronary Stents   After initial evaluation and assessments completed: Virtual Based Care may be provided alone or in conjunction with Phase 2 Cardiac Rehab based on patient barriers.: Yes   Intensive Cardiac Rehabilitation (ICR) [place] location only OR Traditional Cardiac Rehabilitation (TCR) *If criteria for ICR are not met will enroll in TCR [facility] only): Yes        Discharge Medications   Allergies as of 06/22/2022   No Known Allergies      Medication List     STOP taking these medications    TURMERIC PO       TAKE these medications    acetaminophen 500 MG tablet Commonly known as: TYLENOL Take 500-1,000 mg by mouth every 6 (six) hours as needed for moderate pain.   aspirin EC 81 MG tablet Take 1 tablet (81 mg total) by mouth daily. Swallow whole.   cholecalciferol 25 MCG (1000 UNIT) tablet Commonly known as: VITAMIN D3 Take 1,000 Units by mouth daily.   clopidogrel 75 MG tablet Commonly known as: Plavix Take 1 tablet (75 mg total) by mouth daily.   CoQ10 200 MG Caps Take 200 mg by mouth daily.   cyanocobalamin 1000 MCG tablet Commonly known as: VITAMIN B12 Take 1,000 mcg by mouth daily.   Fish Oil 1000 MG Caps Take 2,000 mg by mouth daily.   HAIR/SKIN/NAILS PO Take 1 tablet by mouth daily.   metoprolol succinate 25 MG 24 hr tablet Commonly known as: TOPROL-XL Take 0.5 tablets (12.5 mg total) by mouth at bedtime.   rosuvastatin 20 MG tablet Commonly known as: Crestor Take 1 tablet (20 mg total) by mouth daily.   VITAMIN C PO Take 1 tablet by mouth daily.            Outstanding Labs/Studies   N/A  Duration of Discharge Encounter   Greater than 30 minutes including physician time.  Hilbert Corrigan, PA 06/22/2022, 2:57 PM

## 2022-06-22 NOTE — Interval H&P Note (Signed)
History and Physical Interval Note:  06/22/2022 7:34 AM  Wendy Tate  has presented today for surgery, with the diagnosis of cad - angina.  The various methods of treatment have been discussed with the patient and family. After consideration of risks, benefits and other options for treatment, the patient has consented to  Procedure(s): LEFT HEART CATH AND CORONARY ANGIOGRAPHY (N/A) as a surgical intervention.  The patient's history has been reviewed, patient examined, no change in status, stable for surgery.  I have reviewed the patient's chart and labs.  Questions were answered to the patient's satisfaction.    Cath Lab Visit (complete for each Cath Lab visit)  Clinical Evaluation Leading to the Procedure:   ACS: No.  Non-ACS:    Anginal Classification: CCS III - considered Unstable Angina  Anti-ischemic medical therapy: Minimal Therapy (1 class of medications)  Non-Invasive Test Results: No non-invasive testing performed  Prior CABG: No previous CABG   Glenetta Hew

## 2022-06-23 ENCOUNTER — Encounter (HOSPITAL_COMMUNITY): Payer: Self-pay | Admitting: Cardiology

## 2022-06-24 ENCOUNTER — Telehealth: Payer: Self-pay | Admitting: Cardiovascular Disease

## 2022-06-24 NOTE — Telephone Encounter (Signed)
Spoke with patient regarding recent heart cath Ended up needing to go through groin, now has knot about the size of a nickel which has improved with ice  Denies redness/streaks/warm to touch Area sore but not painful Advised patient to continue to monitor and call back if no improvement   Discussed with Dr Oval Linsey who agreed with recommendations

## 2022-06-24 NOTE — Telephone Encounter (Signed)
Patient is requesting to speak specifically with Dr. Blenda Mounts nurse regarding recent procedure and upcoming appointments.

## 2022-07-02 ENCOUNTER — Ambulatory Visit (HOSPITAL_BASED_OUTPATIENT_CLINIC_OR_DEPARTMENT_OTHER): Payer: Managed Care, Other (non HMO) | Admitting: Family

## 2022-07-03 ENCOUNTER — Ambulatory Visit (HOSPITAL_COMMUNITY): Payer: Managed Care, Other (non HMO) | Attending: Cardiovascular Disease

## 2022-07-03 DIAGNOSIS — I2510 Atherosclerotic heart disease of native coronary artery without angina pectoris: Secondary | ICD-10-CM | POA: Diagnosis present

## 2022-07-03 DIAGNOSIS — I358 Other nonrheumatic aortic valve disorders: Secondary | ICD-10-CM

## 2022-07-03 DIAGNOSIS — I517 Cardiomegaly: Secondary | ICD-10-CM

## 2022-07-03 DIAGNOSIS — I200 Unstable angina: Secondary | ICD-10-CM | POA: Diagnosis present

## 2022-07-03 DIAGNOSIS — R002 Palpitations: Secondary | ICD-10-CM | POA: Diagnosis not present

## 2022-07-03 LAB — ECHOCARDIOGRAM COMPLETE
Area-P 1/2: 3.36 cm2
S' Lateral: 3.1 cm

## 2022-07-05 NOTE — Progress Notes (Unsigned)
Cardiology Office Note:    Date:  07/06/2022   ID:  Wendy Tate, DOB 12-02-1975, MRN [id number]  PCP:  Darreld Mclean, MD   [facility] Cardiologist:  Skeet Latch, MD     Referring MD: Darreld Mclean, MD   No chief complaint on file.   History of Present Illness:    Wendy Tate is a 47 y.o. female with a hx of CAD, prediabetes, palpitations, HLD, elevated calcium score.  She presented to her PCP on 04/02/2022 with complaints of palpitations and shortness of breath for 6 months.  She wore a monitor in December 2023 which showed predominantly normal sinus rhythm and no significant arrhythmias.  She had a coronary centimeters score which was 56, 99th percentile for age race and sex matched controls.  She was then referred to cardiology by her PCP.  She was evaluated by Dr. Oval Linsey on 06/16/2022, at that time she endorsed bothersome palpitations that were associated with shortness of breath and at times exhaustion.  She declined to start any medication at this time for palpitations.  Discussed pursuing coronary CTA versus cardiac catheterization and she preferred to pursue cardiac catheterization.  She underwent a left heart cath on 06/22/2022 which showed severe single-vessel disease with ostial and proximal LAD was 85% stenosed, a DES was placed to her LAD, with lesion reduced to 0%.  Recommended uninterrupted DAPT with aspirin and clopidogrel for 6 months, with ostial LAD stent continue long-term Plavix monotherapy after 6 months.  She was discharged the same day.  Echo complete on 07/03/2022 demonstrated an EF of 55 to 60%, LA was mildly dilated, trivial MR, mild calcification of the AV.  She presents today for follow-up of her recent left heart cath.  Overall, she states that she is feeling better.  Her breathlessness is not as notable as it was.  She endorses 1 episode of palpitations the other evening, but it was brief in duration and not  bothersome.  She has returned to work and is tolerating that well.  She has it on her agenda to follow-up with cardiac rehab and plans to do so likely later this week.  She is interested in other ways she can further reduce her risk factors for future CVD risk. She denies chest pain, dyspnea, pnd, orthopnea, n, v, dizziness, syncope, edema, weight gain, or early satiety.    Past Medical History:  Diagnosis Date   Abnormal Pap smear 04/2007, 04/2009, 07/2011   ASCUS    CAD in native artery 06/16/2022   Chicken pox    Palpitations 06/16/2022   Seasonal allergies     Past Surgical History:  Procedure Laterality Date   ADENOIDECTOMY     APPENDECTOMY     CORONARY STENT INTERVENTION N/A 06/22/2022   Procedure: CORONARY STENT INTERVENTION;  Surgeon: Leonie Man, MD;  Location: [facility];  Service: Cardiovascular;  Laterality: N/A;   DILATATION & CURETTAGE/HYSTEROSCOPY WITH TRUECLEAR N/A 01/18/2013   Procedure: DILATATION & CURETTAGE/HYSTEROSCOPY WITH TRUECLEAR;  Surgeon: Betsy Coder, MD;  Location: [facility];  Service: Gynecology;  Laterality: N/A;  D&C Hysteroscopy with TruClear   INTRAVASCULAR IMAGING/OCT N/A 06/22/2022   Procedure: INTRAVASCULAR IMAGING/OCT;  Surgeon: Leonie Man, MD;  Location: [facility];  Service: Cardiovascular;  Laterality: N/A;   LEEP     LEFT HEART CATH AND CORONARY ANGIOGRAPHY N/A 06/22/2022   Procedure: LEFT HEART CATH AND CORONARY ANGIOGRAPHY;  Surgeon: Leonie Man, MD;  [facility];  Service: Cardiovascular;  Laterality: N/A;   TONSILLECTOMY     WISDOM TOOTH EXTRACTION      Current Medications: Current Meds  Medication Sig   acetaminophen (TYLENOL) 500 MG tablet Take 500-1,000 mg by mouth every 6 (six) hours as needed for moderate pain.   Ascorbic Acid (VITAMIN C PO) Take 1 tablet by mouth daily.   aspirin EC 81 MG tablet Take 1 tablet (81 mg total) by mouth daily. Swallow whole.   Biotin w/ Vitamins C & E  (HAIR/SKIN/NAILS PO) Take 1 tablet by mouth daily.   cholecalciferol (VITAMIN D3) 25 MCG (1000 UNIT) tablet Take 1,000 Units by mouth daily.   clopidogrel (PLAVIX) 75 MG tablet Take 1 tablet (75 mg total) by mouth daily.   Coenzyme Q10 (COQ10) 200 MG CAPS Take 200 mg by mouth daily.   cyanocobalamin (VITAMIN B12) 1000 MCG tablet Take 1,000 mcg by mouth daily.   metoprolol succinate (TOPROL-XL) 25 MG 24 hr tablet Take 0.5 tablets (12.5 mg total) by mouth at bedtime.   Omega-3 Fatty Acids (FISH OIL) 1000 MG CAPS Take 2,000 mg by mouth daily.   rosuvastatin (CRESTOR) 20 MG tablet Take 1 tablet (20 mg total) by mouth daily.     Allergies:   Patient has no known allergies.   Social History   Socioeconomic History   Marital status: Married    Spouse name: Not on file   Number of children: 0   Years of education: Not on file   Highest education level: Some college, no degree  Occupational History   Not on file  Tobacco Use   Smoking status: Light Smoker    Types: Cigarettes    Last attempt to quit: 08/04/2011    Years since quitting: 10.9   Smokeless tobacco: Never   Tobacco comments:    Socially  Vaping Use   Vaping Use: Never used  Substance and Sexual Activity   Alcohol use: Yes    Alcohol/week: 1.0 standard drink of alcohol    Types: 1 Standard drinks or equivalent per week    Comment: socially   Drug use: No    Comment: hx.   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   Highest level of education:  Some college   Lives with wife.  No children.  One story home.   Social Determinants of Health   Financial Resource Strain: Not on file  Food Insecurity: Not on file  Transportation Needs: Not on file  Physical Activity: Not on file  Stress: Not on file  Social Connections: Not on file     Family  History: The patient's family history includes Arthritis in her maternal grandmother; Diabetes in her maternal grandmother and mother; Heart attack in her father and mother; Heart disease in her father, maternal grandmother, and mother; Hodgkin's lymphoma in her maternal uncle; Leukemia in her father; Stroke in her maternal grandmother.  ROS:   Please see the history of present illness.     All other systems reviewed and are negative.  EKGs/Labs/Other Studies Reviewed:    The following studies were reviewed today:  EKG:  EKG is  ordered today.  The ekg ordered today demonstrates NSR, HR 74 bpm.   Recent Labs: 04/02/2022: ALT 14; TSH 1.93 06/16/2022: BUN 15; Creatinine,  Ser 0.72; Hemoglobin 14.2; Platelets 253; Potassium 4.7; Sodium 140  Recent Lipid Panel    Component Value Date/Time   CHOL 188 04/02/2022 0956   TRIG 52.0 04/02/2022 0956   HDL 47.70 04/02/2022 0956   CHOLHDL 4 04/02/2022 0956   VLDL 10.4 04/02/2022 0956   LDLCALC 130 (H) 04/02/2022 0956     Risk Assessment/Calculations:                Physical Exam:    VS:  BP 112/70   Pulse 74   Ht 5' 4"$  (1.626 m)   Wt 185 lb (83.9 kg)   BMI 31.76 kg/m     Wt Readings from Last 3 Encounters:  07/06/22 185 lb (83.9 kg)  06/22/22 180 lb (81.6 kg)  06/16/22 188 lb 11.2 oz (85.6 kg)     GEN:  Well nourished, well developed in no acute distress HEENT: Normal NECK: No JVD; No carotid bruits LYMPHATICS: No lymphadenopathy CARDIAC: RRR, no murmurs, rubs, gallops RESPIRATORY:  Clear to auscultation without rales, wheezing or rhonchi  ABDOMEN: Soft, non-tender, non-distended MUSCULOSKELETAL:  No edema; No deformity  SKIN: Warm and dry.  Right femoral cath site without ecchymosis or hematoma.  Right radial cath site with palpable hematoma approximately 0.25 inch x .25 inch. NEUROLOGIC:  Alert and oriented x 3 PSYCHIATRIC:  Normal affect   ASSESSMENT:    1. CAD in native artery   2. Palpitations   3.  Dyslipidemia, goal LDL below 70   4. Prediabetes    PLAN:    In order of problems listed above:  Coronary artery disease -she had a calcium score of 268 in November 2023 which placed her in the 99th percentile for age and sex matched controls.  She continued to have periods of breathlessness and palpitations and ultimately decided to undergo a left heart cath which revealed 85% stenosis of her proximal LAD.  She had a DES placed to her LAD with 0% residual stenosis. Stable with no anginal symptoms today. No indication for further ischemic evaluation. Continue DAPT x 6 months with aspirin and Plavix, then monotherapy with Plavix for 6 months.  Continue metoprolol, rosuvastatin.  Palpitations -she wore a ZIO monitor in November 2023 which showed predominantly normal sinus rhythm, and no significant arrhythmias.  She has had 1 episode of palpitations as outlined above in the HPI.  Continue with metoprolol at bedtime.   Dyslipidemia -LDL on 04/02/2022 was 130, she is on rosuvastatin 20 mg daily.  Will recheck her direct LDL today and CMET.  If her LDL is still not less than 70, will increase her rosuvastatin to 40 mg daily.  Prediabetes-most recent A1c was 5.6 on 04/02/2022, she is trying to eat better but reports it is very hard to lose weight.  We did discuss indications for GLP-1 agonists, however she declines at this time.      Disposition-check direct LDL, CMET today.  Return in 2 to 3 months.      Medication Adjustments/Labs and Tests Ordered: Current medicines are reviewed at length with the patient today.  Concerns regarding medicines are outlined above.  Orders Placed This Encounter  Procedures   CBC   LDL cholesterol, direct   EKG 12-Lead   No orders of the defined types were placed in this encounter.   Patient Instructions  Medication Instructions:  Your physician recommends that you continue on your current medications as directed. Please refer to the Current Medication list  given to you today.  *If  you need a refill on your cardiac medications before your next appointment, please call your pharmacy*   Lab Work: Your physician recommends that you return for lab work today- Direct LDL, CBC   If you have labs (blood work) drawn today and your tests are completely normal, you will receive your results only by: MyChart Message (if you have MyChart) OR A paper copy in the mail If you have any lab test that is abnormal or we need to change your treatment, we will call you to review the results.  Follow-Up: At [facility], you and your health needs are our priority.  As part of our continuing mission to provide you with exceptional heart care, we have created designated Provider Care Teams.  These Care Teams include your primary Cardiologist (physician) and Advanced Practice Providers (APPs -  Physician Assistants and Nurse Practitioners) who all work together to provide you with the care you need, when you need it.  We recommend signing up for the patient portal called "MyChart".  Sign up information is provided on this After Visit Summary.  MyChart is used to connect with patients for Virtual Visits (Telemedicine).  Patients are able to view lab/test results, encounter notes, upcoming appointments, etc.  Non-urgent messages can be sent to your provider as well.   To learn more about what you can do with MyChart, go to [email or web address].    Your next appointment:   3 month(s)  Provider:   Skeet Latch, MD or Laurann Montana, NP    Other Instructions Heart Healthy Diet Recommendations: A low-salt diet is recommended. Meats should be grilled, baked, or boiled. Avoid fried foods. Focus on lean protein sources like fish or chicken with vegetables and fruits. The American Heart Association is a Microbiologist!  American Heart Association Diet and Lifeystyle Recommendations   Exercise recommendations: The American Heart Association recommends 150  minutes of moderate intensity exercise weekly. Try 30 minutes of moderate intensity exercise 4-5 times per week. This could include walking, jogging, or swimming.     Signed, Trudi Ida, NP  07/06/2022 3:40 PM    [place]

## 2022-07-06 ENCOUNTER — Encounter (HOSPITAL_BASED_OUTPATIENT_CLINIC_OR_DEPARTMENT_OTHER): Payer: Self-pay | Admitting: Cardiology

## 2022-07-06 ENCOUNTER — Ambulatory Visit (HOSPITAL_BASED_OUTPATIENT_CLINIC_OR_DEPARTMENT_OTHER): Payer: Managed Care, Other (non HMO) | Admitting: Cardiology

## 2022-07-06 VITALS — BP 112/70 | HR 74 | Ht 64.0 in | Wt 185.0 lb

## 2022-07-06 DIAGNOSIS — R7303 Prediabetes: Secondary | ICD-10-CM

## 2022-07-06 DIAGNOSIS — E785 Hyperlipidemia, unspecified: Secondary | ICD-10-CM | POA: Diagnosis not present

## 2022-07-06 DIAGNOSIS — R002 Palpitations: Secondary | ICD-10-CM | POA: Diagnosis not present

## 2022-07-06 DIAGNOSIS — I2510 Atherosclerotic heart disease of native coronary artery without angina pectoris: Secondary | ICD-10-CM

## 2022-07-06 NOTE — Patient Instructions (Signed)
Medication Instructions:  Your physician recommends that you continue on your current medications as directed. Please refer to the Current Medication list given to you today.  *If you need a refill on your cardiac medications before your next appointment, please call your pharmacy*   Lab Work: Your physician recommends that you return for lab work today- Direct LDL, CBC   If you have labs (blood work) drawn today and your tests are completely normal, you will receive your results only by: MyChart Message (if you have MyChart) OR A paper copy in the mail If you have any lab test that is abnormal or we need to change your treatment, we will call you to review the results.  Follow-Up: At [facility], you and your health needs are our priority.  As part of our continuing mission to provide you with exceptional heart care, we have created designated Provider Care Teams.  These Care Teams include your primary Cardiologist (physician) and Advanced Practice Providers (APPs -  Physician Assistants and Nurse Practitioners) who all work together to provide you with the care you need, when you need it.  We recommend signing up for the patient portal called "MyChart".  Sign up information is provided on this After Visit Summary.  MyChart is used to connect with patients for Virtual Visits (Telemedicine).  Patients are able to view lab/test results, encounter notes, upcoming appointments, etc.  Non-urgent messages can be sent to your provider as well.   To learn more about what you can do with MyChart, go to [email or web address].    Your next appointment:   3 month(s)  Provider:   Skeet Latch, MD or Laurann Montana, NP    Other Instructions Heart Healthy Diet Recommendations: A low-salt diet is recommended. Meats should be grilled, baked, or boiled. Avoid fried foods. Focus on lean protein sources like fish or chicken with vegetables and fruits. The American Heart Association is a  Microbiologist!  American Heart Association Diet and Lifeystyle Recommendations   Exercise recommendations: The American Heart Association recommends 150 minutes of moderate intensity exercise weekly. Try 30 minutes of moderate intensity exercise 4-5 times per week. This could include walking, jogging, or swimming.

## 2022-07-07 LAB — CBC
Hematocrit: 41.4 % (ref 34.0–46.6)
Hemoglobin: 13.7 g/dL (ref 11.1–15.9)
MCH: 30.8 pg (ref 26.6–33.0)
MCHC: 33.1 g/dL (ref 31.5–35.7)
MCV: 93 fL (ref 79–97)
Platelets: 242 x10E3/uL (ref 150–450)
RBC: 4.45 x10E6/uL (ref 3.77–5.28)
RDW: 12.6 % (ref 11.7–15.4)
WBC: 6.3 x10E3/uL (ref 3.4–10.8)

## 2022-07-07 LAB — LDL CHOLESTEROL, DIRECT: LDL Direct: 61 mg/dL (ref 0–99)

## 2022-07-09 ENCOUNTER — Other Ambulatory Visit: Payer: Self-pay

## 2022-07-09 MED ORDER — CLOPIDOGREL BISULFATE 75 MG PO TABS: 75.0000 mg | ORAL_TABLET | Freq: Every day | ORAL | 1 refills | Status: DC

## 2022-07-09 MED ORDER — METOPROLOL SUCCINATE ER 25 MG PO TB24: 12.5000 mg | ORAL_TABLET | Freq: Every day | ORAL | 1 refills | Status: DC

## 2022-08-14 ENCOUNTER — Ambulatory Visit (HOSPITAL_BASED_OUTPATIENT_CLINIC_OR_DEPARTMENT_OTHER): Payer: Managed Care, Other (non HMO) | Admitting: Family

## 2022-10-13 ENCOUNTER — Telehealth: Payer: Self-pay | Admitting: Cardiovascular Disease

## 2022-10-13 NOTE — Telephone Encounter (Signed)
   Patient Name: Wendy Tate  DOB: 1976-03-09 MRN: [id number]  Primary Cardiologist: Chilton Si, MD  Chart reviewed as part of pre-operative protocol coverage.   Simple dental extractions (i.e. 1-2 teeth) are considered low risk procedures per guidelines and generally do not require any specific cardiac clearance. It is also generally accepted that for simple extractions and dental cleanings, there is no need to interrupt blood thinner therapy.   SBE prophylaxis is not required for the patient from a cardiac standpoint.  I will route this recommendation to the requesting party via Epic fax function and remove from pre-op pool.  Please call with questions.  Napoleon Form, Leodis Rains, NP 10/13/2022, 1:32 PM

## 2022-10-13 NOTE — Telephone Encounter (Signed)
   Pre-operative Risk Assessment    Patient Name: Wendy Tate  DOB: 08/22/1975 MRN: [id number]      Request for Surgical Clearance    Procedure:   Dental Cleaning   Date of Surgery:  Clearance 10/13/22                                 Surgeon:  Langston Masker, DDS  Surgeon's Group or Practice Name:  Langston Masker, DDS  Phone number:  [phone] Fax number:  [phone]   Type of Clearance Requested:   - Medical    Type of Anesthesia:  None    Additional requests/questions:  Does this patient need antibiotics?  Signed, Forest Gleason   10/13/2022, 1:25 PM

## 2022-10-15 ENCOUNTER — Ambulatory Visit (HOSPITAL_BASED_OUTPATIENT_CLINIC_OR_DEPARTMENT_OTHER): Payer: Managed Care, Other (non HMO) | Admitting: Family

## 2022-10-15 ENCOUNTER — Encounter (HOSPITAL_BASED_OUTPATIENT_CLINIC_OR_DEPARTMENT_OTHER): Payer: Self-pay | Admitting: Family

## 2022-10-15 VITALS — BP 114/60 | HR 72 | Ht 64.0 in | Wt 188.0 lb

## 2022-10-15 DIAGNOSIS — E785 Hyperlipidemia, unspecified: Secondary | ICD-10-CM | POA: Diagnosis not present

## 2022-10-15 DIAGNOSIS — I25118 Atherosclerotic heart disease of native coronary artery with other forms of angina pectoris: Secondary | ICD-10-CM | POA: Diagnosis not present

## 2022-10-15 DIAGNOSIS — R002 Palpitations: Secondary | ICD-10-CM

## 2022-10-15 MED ORDER — CLOPIDOGREL BISULFATE 75 MG PO TABS: 75.0000 mg | ORAL_TABLET | Freq: Every day | ORAL | 3 refills | Status: DC

## 2022-10-15 MED ORDER — METOPROLOL SUCCINATE ER 25 MG PO TB24: 12.5000 mg | ORAL_TABLET | Freq: Every day | ORAL | 3 refills | Status: DC

## 2022-10-15 NOTE — Patient Instructions (Addendum)
Medication Instructions:  Your physician has recommended you make the following change in your medication:  Change: Metoprolol Succinate 12.5mg  ( half tablet) daily. You may take an extra half tablet as needed for breakthrough palpitations.  *If you need a refill on your cardiac medications before your next appointment, please call your pharmacy*  Follow-Up: At [facility], you and your health needs are our priority.  As part of our continuing mission to provide you with exceptional heart care, we have created designated Provider Care Teams.  These Care Teams include your primary Cardiologist (physician) and Advanced Practice Providers (APPs -  Physician Assistants and Nurse Practitioners) who all work together to provide you with the care you need, when you need it.  We recommend signing up for the patient portal called "MyChart".  Sign up information is provided on this After Visit Summary.  MyChart is used to connect with patients for Virtual Visits (Telemedicine).  Patients are able to view lab/test results, encounter notes, upcoming appointments, etc.  Non-urgent messages can be sent to your provider as well.   To learn more about what you can do with MyChart, go to [email or web address].    Your next appointment:   6 month(s)  Provider:   Chilton Si, MD or Gillian Shields, NP    Other Instructions To prevent palpitations: Make sure you are adequately hydrated.  Avoid and/or limit caffeine containing beverages like soda or tea. Exercise regularly.  Manage stress well. Some over the counter medications can cause palpitations such as Benadryl, AdvilPM, TylenolPM. Regular Advil or Tylenol do not cause palpitations.    ____________________________________  Right Start Program at [employer]  Sessions include Structured exercise sessions 2 group sessions per week for 9 weeks Monitored by fitness app 20 to 40-minute sessions Post program complications such as a neck  step.  It is free for [facility] Members or $99 for non-members. Financial assistance is available.  No referral required.  Please call [phone], visit [facility] in person, or register online at [email or web address]

## 2022-10-15 NOTE — Progress Notes (Signed)
Office Visit    Patient Name: Wendy Tate Date of Encounter: 10/15/2022  PCP:  Pearline Cables, MD   [facility]  Cardiologist:  Chilton Si, MD  Advanced Practice Provider:  No care team member to display Electrophysiologist:  None      Chief Complaint    Wendy Tate is a 47 y.o. female presents today for CAD follow up    Past Medical History    Past Medical History:  Diagnosis Date   Abnormal Pap smear 04/2007, 04/2009, 07/2011   ASCUS    CAD in native artery 06/16/2022   Chicken pox    Palpitations 06/16/2022   Seasonal allergies    Past Surgical History:  Procedure Laterality Date   ADENOIDECTOMY     APPENDECTOMY     CORONARY IMAGING/OCT N/A 06/22/2022   Procedure: INTRAVASCULAR IMAGING/OCT;  Surgeon: Marykay Lex, MD;  Location: [facility] INVASIVE CV LAB;  Service: Cardiovascular;  Laterality: N/A;   CORONARY STENT INTERVENTION N/A 06/22/2022   Procedure: CORONARY STENT INTERVENTION;  Surgeon: Marykay Lex, MD;  Location: [facility] INVASIVE CV LAB;  Service: Cardiovascular;  Laterality: N/A;   DILATATION & CURETTAGE/HYSTEROSCOPY WITH TRUECLEAR N/A 01/18/2013   Procedure: DILATATION & CURETTAGE/HYSTEROSCOPY WITH TRUECLEAR;  Surgeon: Michael Litter, MD;  Location: [facility];  Service: Gynecology;  Laterality: N/A;  D&C Hysteroscopy with TruClear   LEEP     LEFT HEART CATH AND CORONARY ANGIOGRAPHY N/A 06/22/2022   Procedure: LEFT HEART CATH AND CORONARY ANGIOGRAPHY;  Surgeon: Marykay Lex, MD;  Location: [facility] INVASIVE CV LAB;  Service: Cardiovascular;  Laterality: N/A;   TONSILLECTOMY     WISDOM TOOTH EXTRACTION      Allergies  No Known Allergies  History of Present Illness    Wendy Tate is a 47 y.o. female with a hx of CAD (06/22/22 DES-LAD), prediabetes, palpitations, HLD last seen 07/06/22.  Saw PCP 03/2022 with palpitations. Subsequent monitor 04/2022 predominantly NSR and no significant arrhythmia. Coronary calcium score 268  placing her in the 99th percentile for age/race/sex matched controls. Referred to cardiology with [facility] 06/22/22 severe prox LAD 85% stenosis treated with DES with residual 0% stenosis. Recommended for DAPT aspirin/plavix x 6 months then Plavix monotherapy. Echo 07/03/22 normal LVEF 55-60%, trivial MR.  Presents today independently. Excited for a couple trips to the beach planned this summer. Notes mild shortness of breath. Also stress as mother passed away since last seen. Offered condolences. Episodes of shortness of breath occur both at rest or with activity such as working in her yard. Intermittent palpitations which are overall not bothersome. Has been chewing nicotine gum for many years, discussed cardiac implications. Motivated to lose weight, plans to do Optivia for one month then plan to return to [employer].   EKGs/Labs/Other Studies Reviewed:   The following studies were reviewed today: Cardiac Studies & Procedures   CARDIAC CATHETERIZATION  CARDIAC CATHETERIZATION 06/22/2022  Narrative   CULPRIT LESION: Prox LAD lesion is 85% stenosed.  RFR measurement 0.87-significant.   A drug-eluting stent SYNERGY XD 2.75X16 was placed-deployed to 2.9 mm and postdilated to 3.0 mm (OCT guided).  Post intervention, there is a 0% residual stenosis.   --------------------------------   Suezanne Jacquet Cx to Prox Cx lesion is 15% stenosed.   --------------------------------   The left ventricular systolic function is normal.  The left ventricular ejection fraction is 55-65% by visual estimate. LV end diastolic pressure is normal.   There is no aortic valve stenosis.  POST-CATH DIAGNOSES Severe single-vessel disease with ostial and proximal LAD the 85% stenosis (RFR 0.87) -> Successful DES PCI using Synergy XD 2.75 mm x 16 mm postdilated to 3.0 mm by OCT guidance. Lesion reduced to 0%, TIMI-3 flow pre and post. Otherwise minimal disease. Normal LVEF and LVEDP.   RECOMMENDATIONS Successful Mynx closure device  Left Femoral access and TR band placement on radial access. Significant Right Radial Loop, if further catheterization is recommended would consider Right Ulnar Artery. DAPT per recommendations section If stable okay to discharge after 8 hours.    Bryan Lemma, MD  Findings Coronary Findings Diagnostic  Dominance: Right  Left Main Vessel was injected. Vessel is normal in caliber.  Left Anterior Descending The vessel exhibits minimal luminal irregularities. Prox LAD lesion is 85% stenosed. Vessel is the culprit lesion. The lesion is type B1, located proximal to the major branch, eccentric and irregular. The lesion is mildly calcified. The stenosis was measured by a visual reading. Pressure wire/FFR was performed on the lesion. RFR 0.87-significant Optical coherence tomography (OCT) was performed. Minimum lumen area: 0.75 mm.  First Diagonal Branch Vessel is angiographically normal.  First Septal Branch Vessel is small in size.  Second Diagonal Branch Vessel is angiographically normal.  Second Septal Branch Vessel is small in size.  Ramus Intermedius Vessel is small.  Left Circumflex There is mild focal disease in the vessel. The vessel exhibits minimal luminal irregularities. Ost Cx to Prox Cx lesion is 15% stenosed.  First Obtuse Marginal Branch Vessel is small in size. Vessel is angiographically normal.  Second Obtuse Marginal Branch Vessel is small in size. Vessel is angiographically normal.  Fourth Obtuse Marginal Branch Vessel is small in size. Vessel is angiographically normal.  Right Coronary Artery Vessel was injected. Vessel is Moderate in size Vessel is angiographically normal.  Acute Marginal Branch Vessel is small in size.  Right Ventricular Branch Vessel is small in size.  Right Posterior Descending Artery Vessel is small in size.  First Right Posterolateral Branch Vessel is small in size.  Intervention  Prox LAD lesion Stent Lesion  length:  15 mm. CATH DRAGONFLY OPSTAR guide catheter was inserted. Lesion crossed with guidewire using a GUIDEWIRE PRESSURE X 175. Pre-stent angioplasty was performed using a BALLN EMERGE MR 2.5X15. Maximum pressure:  12 atm. Inflation time:  20 sec. A drug-eluting stent was successfully placed using a SYNERGY XD 2.75X16. Maximum pressure: 16 atm. Inflation time: 30 sec. Stent strut is well apposed. Post-stent angioplasty was performed using a BALL SAPPHIRE NC24 3.0X12. Maximum pressure:  14 atm. Inflation time:  20 sec. Postdilated to 3.0 mm-size by OCT Initially with a pressure X wire across the lesion, RFR was measured was 0.87.  Significant.  This was followed by OCT using DRAGONFLY OPSTAR to size and length of the stent. Post-Intervention Lesion Assessment The intervention was successful. Pre-interventional TIMI flow is 3. Post-intervention TIMI flow is 3. Treated lesion length:  16 mm. No complications occurred at this lesion. There is a 0% residual stenosis post intervention.     ECHOCARDIOGRAM  ECHOCARDIOGRAM COMPLETE 07/03/2022  Narrative ECHOCARDIOGRAM REPORT    Patient Name:   CHANTEY SONES Date of Exam: 07/03/2022 Medical Rec #:  [id number]        Height:       64.0 in Accession #:    [id number]       Weight:       180.0 lb Date of Birth:  [birth year]        BSA:  1.871 m Patient Age:    46 years         BP:           118/80 mmHg Patient Gender: F                HR:           77 bpm. Exam Location:  [address]  Procedure: 2D Echo, 3D Echo, Cardiac Doppler, Color Doppler and Strain Analysis  Indications:    R06.00 Dyspnea  History:        Patient has no prior history of Echocardiogram examinations. CAD; STENT. Palpitations.  Sonographer:    Sedonia Small Rodgers-Jones RDCS Referring Phys: [id number] TIFFANY [place]  IMPRESSIONS   1. Left ventricular ejection fraction, by estimation, is 55 to 60%. The left ventricle has normal function. The left ventricle has no  regional wall motion abnormalities. Left ventricular diastolic parameters were normal. 2. Right ventricular systolic function is normal. The right ventricular size is normal. 3. Left atrial size was mildly dilated. 4. The mitral valve is normal in structure. Trivial mitral valve regurgitation. No evidence of mitral stenosis. 5. The aortic valve is tricuspid. There is mild calcification of the aortic valve. Aortic valve regurgitation is not visualized. No aortic stenosis is present. 6. The inferior vena cava is normal in size with greater than 50% respiratory variability, suggesting right atrial pressure of 3 mmHg.  FINDINGS Left Ventricle: Left ventricular ejection fraction, by estimation, is 55 to 60%. The left ventricle has normal function. The left ventricle has no regional wall motion abnormalities. The left ventricular internal cavity size was normal in size. There is no left ventricular hypertrophy. Left ventricular diastolic parameters were normal.  Right Ventricle: The right ventricular size is normal. No increase in right ventricular wall thickness. Right ventricular systolic function is normal.  Left Atrium: Left atrial size was mildly dilated.  Right Atrium: Right atrial size was normal in size.  Pericardium: There is no evidence of pericardial effusion.  Mitral Valve: The mitral valve is normal in structure. Trivial mitral valve regurgitation. No evidence of mitral valve stenosis.  Tricuspid Valve: The tricuspid valve is normal in structure. Tricuspid valve regurgitation is trivial. No evidence of tricuspid stenosis.  Aortic Valve: The aortic valve is tricuspid. There is mild calcification of the aortic valve. Aortic valve regurgitation is not visualized. No aortic stenosis is present.  Pulmonic Valve: The pulmonic valve was normal in structure. Pulmonic valve regurgitation is trivial. No evidence of pulmonic stenosis.  Aorta: The aortic root is normal in size and  structure.  Venous: The inferior vena cava is normal in size with greater than 50% respiratory variability, suggesting right atrial pressure of 3 mmHg.  IAS/Shunts: No atrial level shunt detected by color flow Doppler.   LEFT VENTRICLE PLAX 2D LVIDd:         4.50 cm   Diastology LVIDs:         3.10 cm   LV e' medial:    9.74 cm/s LV PW:         0.70 cm   LV E/e' medial:  9.0 LV IVS:        0.70 cm   LV e' lateral:   13.10 cm/s LVOT diam:     2.00 cm   LV E/e' lateral: 6.7 LV SV:         68 LV SV Index:   36        2D Longitudinal Strain LVOT Area:  3.14 cm  2D Strain GLS (A2C):   -26.1 % 2D Strain GLS (A3C):   -26.1 % 2D Strain GLS (A4C):   -26.6 % 2D Strain GLS Avg:     -26.3 %  3D Volume EF: 3D EF:        56 % LV EDV:       132 ml LV ESV:       58 ml LV SV:        74 ml  RIGHT VENTRICLE             IVC RV Basal diam:  3.20 cm     IVC diam: 2.00 cm RV S prime:     12.50 cm/s TAPSE (M-mode): 2.3 cm  LEFT ATRIUM             Index        RIGHT ATRIUM           Index LA diam:        4.20 cm 2.25 cm/m   RA Area:     12.60 cm LA Vol (A2C):   45.4 ml 24.27 ml/m  RA Volume:   29.00 ml  15.50 ml/m LA Vol (A4C):   39.8 ml 21.28 ml/m LA Biplane Vol: 42.6 ml 22.77 ml/m AORTIC VALVE LVOT Vmax:   114.00 cm/s LVOT Vmean:  72.033 cm/s LVOT VTI:    0.217 m  AORTA Ao Root diam: 2.90 cm Ao Asc diam:  3.10 cm  MITRAL VALVE MV Area (PHT): 3.36 cm    SHUNTS MV Decel Time: 226 msec    Systemic VTI:  0.22 m MV E velocity: 88.00 cm/s  Systemic Diam: 2.00 cm MV A velocity: 71.30 cm/s MV E/A ratio:  1.23  Arvilla Meres MD Electronically signed by Arvilla Meres MD Signature Date/Time: 07/03/2022/5:20:06 PM    Final    MONITORS  LONG TERM MONITOR (3-14 DAYS) 05/02/2022  Narrative   Normal sinus rhythm with average HR 83 bpm   No significant arrthythmias   Patch Wear Time:  6 days and 17 hours (2023-11-19T19:19:38-0500 to 2023-11-26T12:37:56-0500)  Patient  had a min HR of 46 bpm, max HR of 160 bpm, and avg HR of 83 bpm. Predominant underlying rhythm was Sinus Rhythm. Slight P wave morphology changes were noted. Isolated SVEs were rare (<1.0%), and no SVE Couplets or SVE Triplets were present. Isolated VEs were rare (<1.0%), and no VE Couplets or VE Triplets were present.   CT SCANS  CT CARDIAC SCORING (SELF PAY ONLY) 04/16/2022  Addendum 04/16/2022  8:43 AM ADDENDUM REPORT: 04/16/2022 08:40  EXAM: OVER-READ INTERPRETATION  CT CHEST  The following report is an over-read performed by radiologist Dr. Darlys Gales [facility]: [facility] on 04/16/2022. This over-read does not include interpretation of cardiac or coronary anatomy or pathology. The coronary calcium score interpretation by the cardiologist is attached.  COMPARISON:  None.  FINDINGS: Vascular: No significant extracardiac vascular findings.  Mediastinum/Nodes: No lymphadenopathy.  Lungs/Pleura: No focal airspace disease. No suspicious pulmonary nodules.  Upper Abdomen: No acute abnormality.  Musculoskeletal: No acute osseous abnormality. No suspicious osseous lesion.  IMPRESSION: No significant extracardiac findings in the chest.   Electronically Signed By: Caprice Renshaw M.D. On: 04/16/2022 08:40  Narrative CLINICAL DATA:  Cardiovascular Disease Risk stratification  EXAM: Coronary Calcium Score  TECHNIQUE: A gated, non-contrast computed tomography scan of the heart was performed using 3mm slice thickness. Axial images were analyzed on a dedicated workstation. Calcium scoring of the coronary arteries was performed  using the Agatston method.  FINDINGS: Coronary arteries: Normal origins.  Coronary Calcium Score:  Left main: 0  Left anterior descending artery: 226  Left circumflex artery: 38  Right coronary artery: 4  Total: 268  Percentile: 99  Pericardium: Normal.  Ascending Aorta: Normal caliber.  Non-cardiac: See separate report from  [facility].  IMPRESSION: Coronary calcium score of 268. This was 1 percentile for age-, race-, and sex-matched controls.  RECOMMENDATIONS: Coronary artery calcium (CAC) score is a strong predictor of incident coronary heart disease (CHD) and provides predictive information beyond traditional risk factors. CAC scoring is reasonable to use in the decision to withhold, postpone, or initiate statin therapy in intermediate-risk or selected borderline-risk asymptomatic adults (age 69-75 years and LDL-C >=70 to <190 mg/dL) who do not have diabetes or established atherosclerotic cardiovascular disease (ASCVD).* In intermediate-risk (10-year ASCVD risk >=7.5% to <20%) adults or selected borderline-risk (10-year ASCVD risk >=5% to <7.5%) adults in whom a CAC score is measured for the purpose of making a treatment decision the following recommendations have been made:  If CAC=0, it is reasonable to withhold statin therapy and reassess in 5 to 10 years, as long as higher risk conditions are absent (diabetes mellitus, family history of premature CHD in first degree relatives (males <55 years; females <65 years), cigarette smoking, or LDL >=190 mg/dL).  If CAC is 1 to 99, it is reasonable to initiate statin therapy for patients >=91 years of age.  If CAC is >=100 or >=75th percentile, it is reasonable to initiate statin therapy at any age.  Cardiology referral should be considered for patients with CAC scores >=400 or >=75th percentile.  *2018 AHA/ACC/AACVPR/AAPA/ABC/ACPM/ADA/AGS/APhA/ASPC/NLA/PCNA Guideline on the Management of Blood Cholesterol: A Report of the American College of Cardiology/American Heart Association Task Force on Clinical Practice Guidelines. J Am Coll Cardiol. 2019;73(24):3168-3209.  Electronically Signed: By: Donato Schultz M.D. On: 04/15/2022 17:20           EKG:  EKG is not ordered today.    Recent Labs: 04/02/2022: ALT 14; TSH 1.93 06/16/2022:  BUN 15; Creatinine, Ser 0.72; Potassium 4.7; Sodium 140 07/06/2022: Hemoglobin 13.7; Platelets 242  Recent Lipid Panel    Component Value Date/Time   CHOL 188 04/02/2022 0956   TRIG 52.0 04/02/2022 0956   HDL 47.70 04/02/2022 0956   CHOLHDL 4 04/02/2022 0956   VLDL 10.4 04/02/2022 0956   LDLCALC 130 (H) 04/02/2022 0956   LDLDIRECT 61 07/06/2022 1454    Home Medications   Current Meds  Medication Sig   acetaminophen (TYLENOL) 500 MG tablet Take 500-1,000 mg by mouth every 6 (six) hours as needed for moderate pain.   Ascorbic Acid (VITAMIN C PO) Take 1 tablet by mouth daily.   aspirin EC 81 MG tablet Take 1 tablet (81 mg total) by mouth daily. Swallow whole.   cholecalciferol (VITAMIN D3) 25 MCG (1000 UNIT) tablet Take 1,000 Units by mouth daily.   clopidogrel (PLAVIX) 75 MG tablet Take 1 tablet (75 mg total) by mouth daily.   Coenzyme Q10 (COQ10) 200 MG CAPS Take 200 mg by mouth daily.   cyanocobalamin (VITAMIN B12) 1000 MCG tablet Take 1,000 mcg by mouth daily.   metoprolol succinate (TOPROL-XL) 25 MG 24 hr tablet Take 0.5 tablets (12.5 mg total) by mouth at bedtime.   Omega-3 Fatty Acids (FISH OIL) 1000 MG CAPS Take 2,000 mg by mouth daily.   rosuvastatin (CRESTOR) 20 MG tablet Take 1 tablet (20 mg total) by mouth daily.  Review of Systems      All other systems reviewed and are otherwise negative except as noted above.  Physical Exam    VS:  BP 114/60   Pulse 72   Ht 5\' 4"  (1.626 m)   Wt 188 lb (85.3 kg)   BMI 32.27 kg/m  , BMI Body mass index is 32.27 kg/m.  Wt Readings from Last 3 Encounters:  10/15/22 188 lb (85.3 kg)  07/06/22 185 lb (83.9 kg)  06/22/22 180 lb (81.6 kg)     GEN: Well nourished, well developed, in no acute distress. HEENT: normal. Neck: Supple, no JVD, carotid bruits, or masses. Cardiac: RRR, no murmurs, rubs, or gallops. No clubbing, cyanosis, edema.  Radials/PT 2+ and equal bilaterally.  Respiratory:  Respirations regular and unlabored,  clear to auscultation bilaterally. GI: Soft, nontender, nondistended. MS: No deformity or atrophy. Skin: Warm and dry, no rash. Neuro:  Strength and sensation are intact. Psych: Normal affect.  Assessment & Plan    SOB - Intermittent shortness of breath at rest or with activity. Episodes are infrequent and atypical for angina. No interruption in GDMT for CAD. Encouraged gradual increase in activity as stress, deconditioning likely contributory.   CAD 06/2022 DES-LAD / HLD, LDL goal <70 - Stable with no anginal symptoms. No indication for ischemic evaluation.  GDMT aspirin/plavix, toprol, rosuvastatin. 12/22/22 may discontinue Aspirin and continue Plavix monotherapy if she wishes. Heart healthy diet and regular cardiovascular exercise encouraged.   07/06/22 LDL of 61 at goal <70.  BMI 32 / Prediabetes - Weight loss via diet and exercise encouraged. Discussed the impact being overweight would have on cardiovascular risk. Plans to use Optivia for 1 month then weight watchers. Info provided on Right Start Program.  Palpitations - prior monitor with no significant arrhythmias. Continue Toprol 12.5mg  QD. May take additional 12.5mg  for breakthrough palpitations.  Tobacco use - Chewing nicotine gum. Discussed general reduction. Discussed Wellbutrin one week prior to quit date - she will reach out when she is ready.          Disposition: Follow up in 6 month(s) with Chilton Si, MD or APP.  Signed, Alver Sorrow, NP 10/15/2022, 3:40 PM [facility]

## 2022-10-18 ENCOUNTER — Encounter (HOSPITAL_BASED_OUTPATIENT_CLINIC_OR_DEPARTMENT_OTHER): Payer: Self-pay | Admitting: Family

## 2022-11-20 LAB — HM PAP SMEAR: HM Pap smear: NORMAL

## 2022-11-20 LAB — RESULTS CONSOLE HPV: CHL HPV: NEGATIVE

## 2023-01-12 ENCOUNTER — Encounter (HOSPITAL_BASED_OUTPATIENT_CLINIC_OR_DEPARTMENT_OTHER): Payer: Self-pay

## 2023-01-12 NOTE — Telephone Encounter (Signed)
Please see pharmacy recommendations and advise

## 2023-01-12 NOTE — Telephone Encounter (Signed)
Please advise 

## 2023-01-13 ENCOUNTER — Other Ambulatory Visit (HOSPITAL_COMMUNITY): Payer: Self-pay

## 2023-04-03 NOTE — Patient Instructions (Incomplete)
It was good to see you again today, I will be in touch with your lab work

## 2023-04-03 NOTE — Progress Notes (Unsigned)
[facility] [address] [place], [place] [address] [phone]  Date:  04/05/2023   Name:  Wendy Tate   DOB:  1976-04-12   MRN:  [id number]  PCP:  Pearline Cables, MD    Chief Complaint: No chief complaint on file.   History of Present Illness:  Wendy Tate is a 47 y.o. very pleasant female patient who presents with the following:  Patient seen today for physical exam Most recent visit with myself was 1 year ago History of prediabetes, dysfunctional uterine bleeding, urinary incontinence We did a workup last year for palpitations-she had a coronary calcium score in the 99th percentile for her age.  Cardiology referral turned up in 85% stenosis in her LAD which was treated with a drug-eluting stent. She has recently followed up with cardiology in May of this year-doing well, no angina: SOB - Intermittent shortness of breath at rest or with activity. Episodes are infrequent and atypical for angina. No interruption in GDMT for CAD. Encouraged gradual increase in activity as stress, deconditioning likely contributory.  CAD 06/2022 DES-LAD / HLD, LDL goal <70 - Stable with no anginal symptoms. No indication for ischemic evaluation.  GDMT aspirin/plavix, toprol, rosuvastatin. 12/22/22 may discontinue Aspirin and continue Plavix monotherapy if she wishes. Heart healthy diet and regular cardiovascular exercise encouraged.   07/06/22 LDL of 61 at goal <70. BMI 32 / Prediabetes - Weight loss via diet and exercise encouraged. Discussed the impact being overweight would have on cardiovascular risk. Plans to use Optivia for 1 month then weight watchers. Info provided on Right Start Program. Palpitations - prior monitor with no significant arrhythmias. Continue Toprol 12.5mg  QD. May take additional 12.5mg  for breakthrough palpitations. Tobacco use - Chewing nicotine gum. Discussed general reduction. Discussed Wellbutrin one week  prior to quit date - she will reach out when she is ready.   Time to update Cologuard or have colonoscopy Mammogram due this month Pap on chart for 2021, can update if she would like Patient Active Problem List   Diagnosis Date Noted   Unstable angina (HCC) 06/22/2022   Hyperlipidemia LDL goal <70 06/22/2022   Palpitations 06/16/2022   CAD in native artery 06/16/2022   Anaphylaxis 02/28/2020   Prediabetes 01/02/2020   Bilateral carpal tunnel syndrome 12/30/2016   Obesity 03/18/2016   ASCUS (atypical squamous cells of undetermined significance) on Pap smear 02/16/2012   Hx LEEP (loop electrosurgical excision procedure 02/16/2012    Past Medical History:  Diagnosis Date   Abnormal Pap smear 04/2007, 04/2009, 07/2011   ASCUS    CAD in native artery 06/16/2022   Chicken pox    Palpitations 06/16/2022   Seasonal allergies     Past Surgical History:  Procedure Laterality Date   ADENOIDECTOMY     APPENDECTOMY     CORONARY IMAGING/OCT N/A 06/22/2022   Procedure: INTRAVASCULAR IMAGING/OCT;  Surgeon: Marykay Lex, MD;  Location: [facility] INVASIVE CV LAB;  Service: Cardiovascular;  Laterality: N/A;   CORONARY STENT INTERVENTION N/A 06/22/2022   Procedure: CORONARY STENT INTERVENTION;  Surgeon: Marykay Lex, MD;  Location: [facility] INVASIVE CV LAB;  Service: Cardiovascular;  Laterality: N/A;   DILATATION & CURETTAGE/HYSTEROSCOPY WITH TRUECLEAR N/A 01/18/2013   Procedure: DILATATION & CURETTAGE/HYSTEROSCOPY WITH TRUECLEAR;  Surgeon: Michael Litter, MD;  Location: [facility] ORS;  Service: Gynecology;  Laterality: N/A;  D&C Hysteroscopy with TruClear   LEEP     LEFT HEART CATH AND  CORONARY ANGIOGRAPHY N/A 06/22/2022   Procedure: LEFT HEART CATH AND CORONARY ANGIOGRAPHY;  Surgeon: Marykay Lex, MD;  Location: [facility] INVASIVE CV LAB;  Service: Cardiovascular;  Laterality: N/A;   TONSILLECTOMY     WISDOM TOOTH EXTRACTION      Social History   Tobacco Use   Smoking status: Light Smoker    Current  packs/day: 0.00    Types: Cigarettes    Last attempt to quit: 08/04/2011    Years since quitting: 11.6   Smokeless tobacco: Never   Tobacco comments:    Socially  Vaping Use   Vaping status: Never Used  Substance Use Topics   Alcohol use: Yes    Alcohol/week: 1.0 standard drink of alcohol    Types: 1 Standard drinks or equivalent per week    Comment: socially   Drug use: No    Comment: hx.    Family History  Problem Relation Age of Onset   Heart attack Mother    Heart disease Mother    Diabetes Mother    Heart attack Father    Heart disease Father    Leukemia Father        died at age 26   Hodgkin's lymphoma Maternal Uncle    Heart disease Maternal Grandmother    Arthritis Maternal Grandmother    Stroke Maternal Grandmother    Diabetes Maternal Grandmother     No Known Allergies  Medication list has been reviewed and updated.  Current Outpatient Medications on File Prior to Visit  Medication Sig Dispense Refill   acetaminophen (TYLENOL) 500 MG tablet Take 500-1,000 mg by mouth every 6 (six) hours as needed for moderate pain.     Ascorbic Acid (VITAMIN C PO) Take 1 tablet by mouth daily.     aspirin EC 81 MG tablet Take 1 tablet (81 mg total) by mouth daily. Swallow whole.     cholecalciferol (VITAMIN D3) 25 MCG (1000 UNIT) tablet Take 1,000 Units by mouth daily.     clopidogrel (PLAVIX) 75 MG tablet Take 1 tablet (75 mg total) by mouth daily. 90 tablet 3   Coenzyme Q10 (COQ10) 200 MG CAPS Take 200 mg by mouth daily.     cyanocobalamin (VITAMIN B12) 1000 MCG tablet Take 1,000 mcg by mouth daily.     metoprolol succinate (TOPROL-XL) 25 MG 24 hr tablet Take 0.5 tablets (12.5 mg total) by mouth at bedtime. May take additional half tablet as needed for breakthrough palpitations. 60 tablet 3   nicotine polacrilex (NICORETTE) 2 MG gum Take 2 mg by mouth as needed for smoking cessation.     Omega-3 Fatty Acids (FISH OIL) 1000 MG CAPS Take 2,000 mg by mouth daily.      rosuvastatin (CRESTOR) 20 MG tablet Take 1 tablet (20 mg total) by mouth daily. 90 tablet 3   No current facility-administered medications on file prior to visit.    Review of Systems:  As per HPI- otherwise negative.   Physical Examination: There were no vitals filed for this visit. There were no vitals filed for this visit. There is no height or weight on file to calculate BMI. Ideal Body Weight:    GEN: no acute distress. HEENT: Atraumatic, Normocephalic.  Ears and Nose: No external deformity. CV: RRR, No M/G/R. No JVD. No thrill. No extra heart sounds. PULM: CTA B, no wheezes, crackles, rhonchi. No retractions. No resp. distress. No accessory muscle use. ABD: S, NT, ND, +BS. No rebound. No HSM. EXTR: No c/c/e PSYCH: Normally  interactive. Conversant.    Assessment and Plan: *** Physical exam today.  Encouraged healthy diet and exercise routine Will plan further follow- up pending labs.  Signed Abbe Amsterdam, MD

## 2023-04-05 ENCOUNTER — Ambulatory Visit (INDEPENDENT_AMBULATORY_CARE_PROVIDER_SITE_OTHER): Payer: BC Managed Care – PPO | Admitting: Family Medicine

## 2023-04-05 VITALS — BP 110/82 | HR 74 | Temp 98.1°F | Resp 18 | Ht 64.0 in | Wt 185.2 lb

## 2023-04-05 DIAGNOSIS — Z8639 Personal history of other endocrine, nutritional and metabolic disease: Secondary | ICD-10-CM | POA: Diagnosis not present

## 2023-04-05 DIAGNOSIS — Z130 Encounter for screening for diseases of the blood and blood-forming organs and certain disorders involving the immune mechanism: Secondary | ICD-10-CM

## 2023-04-05 DIAGNOSIS — I2510 Atherosclerotic heart disease of native coronary artery without angina pectoris: Secondary | ICD-10-CM

## 2023-04-05 DIAGNOSIS — Z1211 Encounter for screening for malignant neoplasm of colon: Secondary | ICD-10-CM

## 2023-04-05 DIAGNOSIS — Z1329 Encounter for screening for other suspected endocrine disorder: Secondary | ICD-10-CM | POA: Diagnosis not present

## 2023-04-05 DIAGNOSIS — M62838 Other muscle spasm: Secondary | ICD-10-CM

## 2023-04-05 DIAGNOSIS — Z1231 Encounter for screening mammogram for malignant neoplasm of breast: Secondary | ICD-10-CM

## 2023-04-05 DIAGNOSIS — Z0000 Encounter for general adult medical examination without abnormal findings: Secondary | ICD-10-CM

## 2023-04-05 DIAGNOSIS — R7303 Prediabetes: Secondary | ICD-10-CM

## 2023-04-05 DIAGNOSIS — R931 Abnormal findings on diagnostic imaging of heart and coronary circulation: Secondary | ICD-10-CM

## 2023-04-05 DIAGNOSIS — Z13220 Encounter for screening for lipoid disorders: Secondary | ICD-10-CM

## 2023-04-05 LAB — CBC
HCT: 43.1 % (ref 36.0–46.0)
Hemoglobin: 14.3 g/dL (ref 12.0–15.0)
MCHC: 33.1 g/dL (ref 30.0–36.0)
MCV: 95.5 fL (ref 78.0–100.0)
Platelets: 267 10*3/uL (ref 150.0–400.0)
RBC: 4.52 Mil/uL (ref 3.87–5.11)
RDW: 12.9 % (ref 11.5–15.5)
WBC: 5.2 10*3/uL (ref 4.0–10.5)

## 2023-04-05 LAB — VITAMIN D 25 HYDROXY (VIT D DEFICIENCY, FRACTURES): VITD: 44.89 ng/mL (ref 30.00–100.00)

## 2023-04-05 LAB — HEMOGLOBIN A1C: Hgb A1c MFr Bld: 5.6 % (ref 4.6–6.5)

## 2023-04-05 LAB — TSH: TSH: 1.75 u[IU]/mL (ref 0.35–5.50)

## 2023-04-05 MED ORDER — METHOCARBAMOL 500 MG PO TABS: 500.0000 mg | ORAL_TABLET | Freq: Two times a day (BID) | ORAL | 0 refills | Status: DC

## 2023-04-05 MED ORDER — CYCLOBENZAPRINE HCL 10 MG PO TABS: 10.0000 mg | ORAL_TABLET | Freq: Every day | ORAL | 0 refills | Status: DC

## 2023-04-05 MED ORDER — ROSUVASTATIN CALCIUM 20 MG PO TABS: 20.0000 mg | ORAL_TABLET | Freq: Every day | ORAL | 3 refills | Status: DC

## 2023-04-06 ENCOUNTER — Encounter: Payer: Self-pay | Admitting: Family Medicine

## 2023-04-06 LAB — COMPREHENSIVE METABOLIC PANEL
ALT: 20 U/L (ref 0–35)
AST: 19 U/L (ref 0–37)
Albumin: 5 g/dL (ref 3.5–5.2)
Alkaline Phosphatase: 63 U/L (ref 39–117)
BUN: 16 mg/dL (ref 6–23)
CO2: 30 meq/L (ref 19–32)
Calcium: 10 mg/dL (ref 8.4–10.5)
Chloride: 99 meq/L (ref 96–112)
Creatinine, Ser: 0.79 mg/dL (ref 0.40–1.20)
GFR: 88.31 mL/min (ref >60.00)
Glucose, Bld: 89 mg/dL (ref 70–99)
Potassium: 3.9 meq/L (ref 3.5–5.1)
Sodium: 139 meq/L (ref 135–145)
Total Bilirubin: 0.5 mg/dL (ref 0.2–1.2)
Total Protein: 7 g/dL (ref 6.0–8.3)

## 2023-04-06 LAB — LIPID PANEL
Cholesterol: 125 mg/dL (ref 0–200)
HDL: 47.2 mg/dL (ref >39.00)
LDL Cholesterol: 62 mg/dL (ref 0–99)
NonHDL: 77.76
Total CHOL/HDL Ratio: 3
Triglycerides: 79 mg/dL (ref 0.0–149.0)
VLDL: 15.8 mg/dL (ref 0.0–40.0)

## 2023-04-07 ENCOUNTER — Other Ambulatory Visit: Payer: Self-pay | Admitting: Family Medicine

## 2023-04-07 DIAGNOSIS — Z1231 Encounter for screening mammogram for malignant neoplasm of breast: Secondary | ICD-10-CM

## 2023-04-12 ENCOUNTER — Encounter (HOSPITAL_BASED_OUTPATIENT_CLINIC_OR_DEPARTMENT_OTHER): Payer: Self-pay | Admitting: Cardiovascular Disease

## 2023-04-12 ENCOUNTER — Ambulatory Visit (HOSPITAL_BASED_OUTPATIENT_CLINIC_OR_DEPARTMENT_OTHER): Payer: BC Managed Care – PPO | Admitting: Cardiovascular Disease

## 2023-04-12 VITALS — BP 122/74 | HR 79 | Ht 64.5 in | Wt 192.5 lb

## 2023-04-12 DIAGNOSIS — I25118 Atherosclerotic heart disease of native coronary artery with other forms of angina pectoris: Secondary | ICD-10-CM

## 2023-04-12 DIAGNOSIS — R7303 Prediabetes: Secondary | ICD-10-CM | POA: Diagnosis not present

## 2023-04-12 DIAGNOSIS — R002 Palpitations: Secondary | ICD-10-CM

## 2023-04-12 DIAGNOSIS — E669 Obesity, unspecified: Secondary | ICD-10-CM

## 2023-04-12 DIAGNOSIS — I2510 Atherosclerotic heart disease of native coronary artery without angina pectoris: Secondary | ICD-10-CM

## 2023-04-12 DIAGNOSIS — E785 Hyperlipidemia, unspecified: Secondary | ICD-10-CM

## 2023-04-12 MED ORDER — WEGOVY 0.25 MG/0.5ML ~~LOC~~ SOAJ: 0.2500 mg | SUBCUTANEOUS | 0 refills | Status: DC

## 2023-04-12 MED ORDER — WEGOVY 0.5 MG/0.5ML ~~LOC~~ SOAJ: 0.5000 mg | SUBCUTANEOUS | 2 refills | Status: DC

## 2023-04-12 MED ORDER — METOPROLOL SUCCINATE ER 25 MG PO TB24: 25.0000 mg | ORAL_TABLET | Freq: Every day | ORAL | 3 refills | Status: DC

## 2023-04-12 NOTE — Patient Instructions (Signed)
Medication Instructions:  INCREASE METOPROLOL TO FULL TABLET DAILY, OK TO USE FULL TABLET AS NEEDED   START WEGOVY  INJECT 0.25 MG WEEKLY FOR 4 WEEKS  INCREASE TO 0.5 MG WEEKLY UNTIL FOLLOW UP   STOP CLOPIDOGREL (PLAVIX) IN FEBRUARY   *If you need a refill on your cardiac medications before your next appointment, please call your pharmacy*  Lab Work: NONE  Testing/Procedures: NONE  Follow-Up: At [facility], you and your health needs are our priority.  As part of our continuing mission to provide you with exceptional heart care, we have created designated Provider Care Teams.  These Care Teams include your primary Cardiologist (physician) and Advanced Practice Providers (APPs -  Physician Assistants and Nurse Practitioners) who all work together to provide you with the care you need, when you need it.  We recommend signing up for the patient portal called "MyChart".  Sign up information is provided on this After Visit Summary.  MyChart is used to connect with patients for Virtual Visits (Telemedicine).  Patients are able to view lab/test results, encounter notes, upcoming appointments, etc.  Non-urgent messages can be sent to your provider as well.   To learn more about what you can do with MyChart, go to [email or web address].    Your next appointment:   3 month(s)  Provider:   Gillian Shields, NP   6 MONTHS WITH DR [facility]

## 2023-04-12 NOTE — Progress Notes (Signed)
Cardiology Office Note:  .   Date:  04/12/2023  ID:  Wendy Tate, DOB [birth year], MRN [id number] PCP: Pearline Cables, MD  [facility] Providers Cardiologist:  Chilton Si, MD    History of Present Illness: .    Wendy Tate is a 47 y.o. female with a hx of CAD, prediabetes, and prior tobacco abuse here for follow-up.  She was first seen 05/2022 for elevated calcium score. She was seen by Dr. Patsy Lager 04/02/2022 and reported palpitations and SOB for about 6 months. She wore a monitor 04/2022 showing predominantly NSR and no significant arrhythmias. She had a coronary CT 04/15/2022 revealing a coronary calcium score of 268 (99th percentile). She was referred to cardiology for further evaluation.  At her initial visit she also reported palpitations.  Prior monitor revealed PACs and episodes of sinus tachycardia at rest.  She noted shortness of breath when bending over.  Given her elevated calcium score and some exertional dyspnea she was referred for cardiac catheterization 06/2022 which revealed 85% proximal LAD stenosis.  He successfully had a Synergy DES placed.  The proximal left circumflex was 15% stenosed.  LVEF was 55-65%.  She had an echo which also revealed LVEF 55-60% with normal diastolic function and normal RV function.  She saw Wallis Bamberg, NP later that month and was feeling much better.  Follow-up with Gillian Shields, NP 09/2022 and was encouraged to stop using nicotine gum.  We also discussed weight loss options.    [name] presents with ongoing palpitations. She describes the sensation as a quivering in the chest, often occurring when she is at rest, particularly when lying down to sleep or leaning forward. The patient reports that these episodes can last for a few minutes and have been frequent enough to delay sleep onset, but do not awaken her from sleep. She also notes an increase in these episodes during periods of high stress and around the time of her  menstrual cycle, suggesting a possible hormonal influence.  She also reports a significant family history of obesity and expresses concern about her own weight gain, particularly in the abdominal region. Despite these concerns, the patient has struggled to establish a regular exercise routine. She has been considering the use of Wegovy, a medication previously offered to her for weight management, and is now interested in starting this treatment.      ROS:  As per HPI  Studies Reviewed: .       Echo 06/2022:  1. Left ventricular ejection fraction, by estimation, is 55 to 60%. The  left ventricle has normal function. The left ventricle has no regional  wall motion abnormalities. Left ventricular diastolic parameters were  normal.   2. Right ventricular systolic function is normal. The right ventricular  size is normal.   3. Left atrial size was mildly dilated.   4. The mitral valve is normal in structure. Trivial mitral valve  regurgitation. No evidence of mitral stenosis.   5. The aortic valve is tricuspid. There is mild calcification of the  aortic valve. Aortic valve regurgitation is not visualized. No aortic  stenosis is present.   6. The inferior vena cava is normal in size with greater than 50%  respiratory variability, suggesting right atrial pressure of 3 mmHg.   LHC 06/2022:   CULPRIT LESION: Prox LAD lesion is 85% stenosed.  RFR measurement 0.87-significant.   A drug-eluting stent SYNERGY XD [id number] was placed-deployed to 2.9 mm and postdilated to 3.0 mm (OCT  guided).  Post intervention, there is a 0% residual stenosis.   --------------------------------   Suezanne Jacquet Cx to Prox Cx lesion is 15% stenosed.   --------------------------------   The left ventricular systolic function is normal.  The left ventricular ejection fraction is 55-65% by visual estimate. LV end diastolic pressure is normal.   There is no aortic valve stenosis.   POST-CATH DIAGNOSES Severe single-vessel disease  with ostial and proximal LAD the 85% stenosis (RFR 0.87) ->  Successful DES PCI using Synergy XD 2.75 mm x 16 mm postdilated to 3.0 mm by OCT guidance. Lesion reduced to 0%, TIMI-3 flow pre and post. Otherwise minimal disease. Normal LVEF and LVEDP.      RECOMMENDATIONS Successful Mynx closure device Left Femoral access and TR band placement on radial access. Significant Right Radial Loop, if further catheterization is recommended would consider Right Ulnar Artery. DAPT per recommendations section If stable okay to discharge after 8 hours.    Risk Assessment/Calculations:             Physical Exam:   VS:  BP 122/74 (BP Location: Left Arm, Patient Position: Sitting, Cuff Size: Normal)   Pulse 79   Ht 5' 4.5" (1.638 m)   Wt 192 lb 8 oz (87.3 kg)   SpO2 96%   BMI 32.53 kg/m  , BMI Body mass index is 32.53 kg/m. GENERAL:  Well appearing HEENT: Pupils equal round and reactive, fundi not visualized, oral mucosa unremarkable NECK:  No jugular venous distention, waveform within normal limits, carotid upstroke brisk and symmetric, no bruits, no thyromegaly LUNGS:  Clear to auscultation bilaterally HEART:  RRR.  PMI not displaced or sustained,S1 and S2 within normal limits, no S3, no S4, no clicks, no rubs, no murmurs ABD:  Flat, positive bowel sounds normal in frequency in pitch, no bruits, no rebound, no guarding, no midline pulsatile mass, no hepatomegaly, no splenomegaly EXT:  2 plus pulses throughout, no edema, no cyanosis no clubbing SKIN:  No rashes no nodules NEURO:  Cranial nerves II through XII grossly intact, motor grossly intact throughout PSYCH:  Cognitively intact, oriented to person place and time   ASSESSMENT AND PLAN: .    # Palpitations: # PACs/PVCs: # Inappropriate sinus tachycardia:  Frequent episodes, often at rest or when lying down. Holter monitor showed PVCs and PACs, which are benign. Patient also reports inappropriate sinus tachycardia which was seen on the  monitor. -Increase Metoprolol to 25mg  daily.  -Continue to take an extra dose as needed for palpitations.  #Obesity: Patient has a family history of obesity and is concerned about increasing abdominal girth. Discussed the benefits of Wegovy for weight loss and cardiovascular risk reduction. -Start [facility] 0.25mg  weekly for a month, then increase to 0.5mg  weekly. -If insurance does not cover [facility], consult with pharmacist for alternative options.  # Coronary Artery Disease s/p PCI: # Hyperlipidemia: Status post stent placement, currently asymptomatic. Cholesterol levels are well controlled. -Continue Aspirin indefinitely. -Discontinue Plavix in February 2025. -No further imaging required unless symptoms recur. -Continue metoprolol and rosuvastatin.  Lipids well-controlled.   Follow-up in 6 months with me.  APP in 3 months.       Signed, Chilton Si, MD

## 2023-04-13 ENCOUNTER — Other Ambulatory Visit (HOSPITAL_BASED_OUTPATIENT_CLINIC_OR_DEPARTMENT_OTHER): Payer: Self-pay

## 2023-04-13 MED ORDER — CLOPIDOGREL BISULFATE 75 MG PO TABS: 75.0000 mg | ORAL_TABLET | Freq: Every day | ORAL | 0 refills | Status: DC

## 2023-04-19 ENCOUNTER — Other Ambulatory Visit (HOSPITAL_COMMUNITY): Payer: Self-pay

## 2023-04-19 DIAGNOSIS — Z1211 Encounter for screening for malignant neoplasm of colon: Secondary | ICD-10-CM | POA: Diagnosis not present

## 2023-04-26 ENCOUNTER — Encounter: Payer: Self-pay | Admitting: Family Medicine

## 2023-04-26 LAB — COLOGUARD: COLOGUARD: NEGATIVE

## 2023-05-04 ENCOUNTER — Ambulatory Visit
Admission: RE | Admit: 2023-05-04 | Discharge: 2023-05-04 | Disposition: A | Discharge status: Home / Self Care | Payer: BC Managed Care – PPO | Source: Ambulatory Visit

## 2023-05-04 DIAGNOSIS — Z1231 Encounter for screening mammogram for malignant neoplasm of breast: Secondary | ICD-10-CM

## 2023-05-07 ENCOUNTER — Encounter (HOSPITAL_BASED_OUTPATIENT_CLINIC_OR_DEPARTMENT_OTHER): Payer: Self-pay | Admitting: Cardiovascular Disease

## 2023-05-07 ENCOUNTER — Telehealth (HOSPITAL_BASED_OUTPATIENT_CLINIC_OR_DEPARTMENT_OTHER): Payer: Self-pay

## 2023-05-07 NOTE — Telephone Encounter (Signed)
Called and spoke to patient regarding PA. First time picking up medication; pharmacy stated medication needs prior auth. Made pt aware that this nurse will send message to PA team.   Message sent to prior auth team to initiate PA process.

## 2023-05-10 ENCOUNTER — Telehealth (HOSPITAL_BASED_OUTPATIENT_CLINIC_OR_DEPARTMENT_OTHER): Payer: Self-pay | Admitting: Pharmacy Technician

## 2023-05-10 ENCOUNTER — Other Ambulatory Visit (HOSPITAL_COMMUNITY): Payer: Self-pay

## 2023-05-10 ENCOUNTER — Telehealth: Payer: BC Managed Care – PPO | Admitting: Family Medicine

## 2023-05-10 ENCOUNTER — Other Ambulatory Visit (HOSPITAL_BASED_OUTPATIENT_CLINIC_OR_DEPARTMENT_OTHER): Payer: Self-pay

## 2023-05-10 DIAGNOSIS — M545 Low back pain, unspecified: Secondary | ICD-10-CM | POA: Diagnosis not present

## 2023-05-10 MED ORDER — PREDNISONE 10 MG (21) PO TBPK: ORAL_TABLET | ORAL | 0 refills | Status: DC

## 2023-05-10 NOTE — Progress Notes (Signed)
Wegovy removed from pt's med list. Insurance not covered. Patient made aware through MyChart message.

## 2023-05-10 NOTE — Telephone Encounter (Signed)
Pharmacy Patient Advocate Encounter  Received notification from [facility] that Prior Authorization for wegovy has been DENIED.  Full denial letter will be uploaded to the media tab. See denial reason below.   PA #/Case ID/Reference #: [id number]

## 2023-05-10 NOTE — Telephone Encounter (Signed)
Pharmacy Patient Advocate Encounter   Received notification from Patient Advice Request messages that prior authorization for wegovy is required/requested.   Insurance verification completed.   The patient is insured through [facility] .   Per test claim: PA required; PA submitted to above mentioned insurance via CoverMyMeds Key/confirmation #/EOC [id number] Status is pending

## 2023-05-10 NOTE — Progress Notes (Signed)
E-Visit for Back Pain   We are sorry that you are not feeling well.  Here is how we plan to help!  Based on what you have shared with me it looks like you mostly have acute back pain.  Acute back pain is defined as musculoskeletal pain that can resolve in 1-3 weeks with conservative treatment.  I have prescribed a prednisone dose pack you can take with NSAIDs or your Flexeril Some patients experience stomach irritation or in increased heartburn with anti-inflammatory drugs.  Please keep in mind that muscle relaxer's can cause fatigue and should not be taken while at work or driving.  Back pain is very common.  The pain often gets better over time.  The cause of back pain is usually not dangerous.  Most people can learn to manage their back pain on their own.  Home Care Stay active.  Start with short walks on flat ground if you can.  Try to walk farther each day. Do not sit, drive or stand in one place for more than 30 minutes.  Do not stay in bed. Do not avoid exercise or work.  Activity can help your back heal faster. Be careful when you bend or lift an object.  Bend at your knees, keep the object close to you, and do not twist. Sleep on a firm mattress.  Lie on your side, and bend your knees.  If you lie on your back, put a pillow under your knees. Only take medicines as told by your doctor. Put ice on the injured area. Put ice in a plastic bag Place a towel between your skin and the bag Leave the ice on for 15-20 minutes, 3-4 times a day for the first 2-3 days. 210 After that, you can switch between ice and heat packs. Ask your doctor about back exercises or massage. Avoid feeling anxious or stressed.  Find good ways to deal with stress, such as exercise.  Get Help Right Way If: Your pain does not go away with rest or medicine. Your pain does not go away in 1 week. You have new problems. You do not feel well. The pain spreads into your legs. You cannot control when you poop (bowel  movement) or pee (urinate) You feel sick to your stomach (nauseous) or throw up (vomit) You have belly (abdominal) pain. You feel like you may pass out (faint). If you develop a fever.  Make Sure you: Understand these instructions. Will watch your condition Will get help right away if you are not doing well or get worse.  Your e-visit answers were reviewed by a board certified advanced clinical practitioner to complete your personal care plan.  Depending on the condition, your plan could have included both over the counter or prescription medications.  If there is a problem please reply  once you have received a response from your provider.  Your safety is important to [place].  If you have drug allergies check your prescription carefully.    You can use MyChart to ask questions about today's visit, request a non-urgent call back, or ask for a work or school excuse for 24 hours related to this e-Visit. If it has been greater than 24 hours you will need to follow up with your provider, or enter a new e-Visit to address those concerns.  You will get an e-mail in the next two days asking about your experience.  I hope that your e-visit has been valuable and will speed your recovery. Thank you  for using e-visits.  I provided 5 minutes of non face-to-face time during this encounter for chart review, medication and order placement, as well as and documentation.

## 2023-07-13 ENCOUNTER — Ambulatory Visit (HOSPITAL_BASED_OUTPATIENT_CLINIC_OR_DEPARTMENT_OTHER): Payer: BC Managed Care – PPO | Admitting: Family

## 2023-07-13 VITALS — BP 118/74 | HR 75 | Ht 64.5 in | Wt 197.0 lb

## 2023-07-13 DIAGNOSIS — Z720 Tobacco use: Secondary | ICD-10-CM

## 2023-07-13 DIAGNOSIS — I25118 Atherosclerotic heart disease of native coronary artery with other forms of angina pectoris: Secondary | ICD-10-CM

## 2023-07-13 DIAGNOSIS — E785 Hyperlipidemia, unspecified: Secondary | ICD-10-CM | POA: Diagnosis not present

## 2023-07-13 DIAGNOSIS — R0609 Other forms of dyspnea: Secondary | ICD-10-CM

## 2023-07-13 DIAGNOSIS — Z6833 Body mass index (BMI) 33.0-33.9, adult: Secondary | ICD-10-CM

## 2023-07-13 DIAGNOSIS — R002 Palpitations: Secondary | ICD-10-CM | POA: Diagnosis not present

## 2023-07-13 MED ORDER — BUPROPION HCL ER (SR) 150 MG PO TB12: ORAL_TABLET | ORAL | 5 refills | Status: DC

## 2023-07-13 NOTE — Progress Notes (Signed)
 Cardiology Office Note:  .   Date:  07/17/2023  ID:  Sandi Carne, DOB [birth year], MRN [id number] PCP: Pearline Cables, MD  [facility] Providers Cardiologist:  Chilton Si, MD    History of Present Illness: .   Wendy Tate is a 48 y.o. female with a hx of CAD (06/22/22 DES-LAD), prediabetes, palpitations, HLD last seen 07/06/22.  Saw PCP 03/2022 with palpitations. Subsequent monitor 04/2022 predominantly NSR and no significant arrhythmia. Coronary calcium score 268 placing her in the 99th percentile for age/race/sex matched controls. Referred to cardiology with [facility] 06/22/22 severe prox LAD 85% stenosis treated with DES with residual 0% stenosis. Recommended for DAPT aspirin/plavix x 6 months then Plavix monotherapy. Echo 07/03/22 normal LVEF 55-60%, trivial MR.  At visit 09/2022, Toprol adjusted to 12.5mg  daily with additional 12.5mg  PRN for breakthrough palpitations.   Last seen 03/2023. Toprol increased to 25mg  daily. Prior authorization attempted for [facility] submitted, but not approved.   Presents today independently. Reports dyspnea that is sporadic that lasts long enough for her to catch her breath and then independently resolves. Feels like she has to "yawn" to catch her breath. Occasional orthopnea. No PND. Swelling in hands in the morning which improves as the day progresses. Mild swelling in feet usually be end of the day. Since changing metoprolol palpitations have been better. No chest pain, pressure, rightness. Does note she had some dyspnea prior to stenting but this is less significant. Remains frustrated by difficulty with weight loss despite dietary changes. Has been walking for exercise.   ROS: Please see the history of present illness.    All other systems reviewed and are negative.   Studies Reviewed: Marland Kitchen   EKG Interpretation Date/Time:  Tuesday July 13 2023 15:14:41 EST Ventricular Rate:  68 PR Interval:  146 QRS Duration:  76 QT Interval:  374 QTC  Calculation: 397 R Axis:   72  Text Interpretation: Normal sinus rhythm Normal ECG Confirmed by Gillian Shields ([id number]) on 07/13/2023 3:33:59 PM    Cardiac Studies & Procedures   ______________________________________________________________________________________________ CARDIAC CATHETERIZATION  CARDIAC CATHETERIZATION 06/22/2022  Narrative   CULPRIT LESION: Prox LAD lesion is 85% stenosed.  RFR measurement 0.87-significant.   A drug-eluting stent SYNERGY XD 2.75X16 was placed-deployed to 2.9 mm and postdilated to 3.0 mm (OCT guided).  Post intervention, there is a 0% residual stenosis.   --------------------------------   Suezanne Jacquet Cx to Prox Cx lesion is 15% stenosed.   --------------------------------   The left ventricular systolic function is normal.  The left ventricular ejection fraction is 55-65% by visual estimate. LV end diastolic pressure is normal.   There is no aortic valve stenosis.  POST-CATH DIAGNOSES Severe single-vessel disease with ostial and proximal LAD the 85% stenosis (RFR 0.87) -> Successful DES PCI using Synergy XD 2.75 mm x 16 mm postdilated to 3.0 mm by OCT guidance. Lesion reduced to 0%, TIMI-3 flow pre and post. Otherwise minimal disease. Normal LVEF and LVEDP.   RECOMMENDATIONS Successful Mynx closure device Left Femoral access and TR band placement on radial access. Significant Right Radial Loop, if further catheterization is recommended would consider Right Ulnar Artery. DAPT per recommendations section If stable okay to discharge after 8 hours.    Bryan Lemma, MD  Findings Coronary Findings Diagnostic  Dominance: Right  Left Main Vessel was injected. Vessel is normal in caliber.  Left Anterior Descending The vessel exhibits minimal luminal irregularities. Prox LAD lesion is 85% stenosed. Vessel is the culprit lesion. The lesion is  type B1, located proximal to the major branch, eccentric and irregular. The lesion is mildly calcified. The  stenosis was measured by a visual reading. Pressure wire/FFR was performed on the lesion. RFR 0.87-significant Optical coherence tomography (OCT) was performed. Minimum lumen area: 0.75 mm.  First Diagonal Branch Vessel is angiographically normal.  First Septal Branch Vessel is small in size.  Second Diagonal Branch Vessel is angiographically normal.  Second Septal Branch Vessel is small in size.  Ramus Intermedius Vessel is small.  Left Circumflex There is mild focal disease in the vessel. The vessel exhibits minimal luminal irregularities. Ost Cx to Prox Cx lesion is 15% stenosed.  First Obtuse Marginal Branch Vessel is small in size. Vessel is angiographically normal.  Second Obtuse Marginal Branch Vessel is small in size. Vessel is angiographically normal.  Fourth Obtuse Marginal Branch Vessel is small in size. Vessel is angiographically normal.  Right Coronary Artery Vessel was injected. Vessel is Moderate in size Vessel is angiographically normal.  Acute Marginal Branch Vessel is small in size.  Right Ventricular Branch Vessel is small in size.  Right Posterior Descending Artery Vessel is small in size.  First Right Posterolateral Branch Vessel is small in size.  Intervention  Prox LAD lesion Stent Lesion length:  15 mm. CATH DRAGONFLY OPSTAR guide catheter was inserted. Lesion crossed with guidewire using a GUIDEWIRE PRESSURE X 175. Pre-stent angioplasty was performed using a BALLN EMERGE MR 2.5X15. Maximum pressure:  12 atm. Inflation time:  20 sec. A drug-eluting stent was successfully placed using a SYNERGY XD 2.75X16. Maximum pressure: 16 atm. Inflation time: 30 sec. Stent strut is well apposed. Post-stent angioplasty was performed using a BALL SAPPHIRE NC24 3.0X12. Maximum pressure:  14 atm. Inflation time:  20 sec. Postdilated to 3.0 mm-size by OCT Initially with a pressure X wire across the lesion, RFR was measured was 0.87.  Significant.  This was  followed by OCT using DRAGONFLY OPSTAR to size and length of the stent. Post-Intervention Lesion Assessment The intervention was successful. Pre-interventional TIMI flow is 3. Post-intervention TIMI flow is 3. Treated lesion length:  16 mm. No complications occurred at this lesion. There is a 0% residual stenosis post intervention.     ECHOCARDIOGRAM  ECHOCARDIOGRAM COMPLETE 07/03/2022  Narrative ECHOCARDIOGRAM REPORT    Patient Name:   MAKINA SKOW Date of Exam: 07/03/2022 Medical Rec #:  [id number]        Height:       64.0 in Accession #:    [id number]       Weight:       180.0 lb Date of Birth:  [birth year]        BSA:          1.871 m Patient Age:    46 years         BP:           118/80 mmHg Patient Gender: F                HR:           77 bpm. Exam Location:  [address]  Procedure: 2D Echo, 3D Echo, Cardiac Doppler, Color Doppler and Strain Analysis  Indications:    R06.00 Dyspnea  History:        Patient has no prior history of Echocardiogram examinations. CAD; STENT. Palpitations.  Sonographer:    Sedonia Small Rodgers-Jones RDCS Referring Phys: [id number] TIFFANY [place]  IMPRESSIONS   1. Left ventricular ejection fraction, by estimation, is 55 to  60%. The left ventricle has normal function. The left ventricle has no regional wall motion abnormalities. Left ventricular diastolic parameters were normal. 2. Right ventricular systolic function is normal. The right ventricular size is normal. 3. Left atrial size was mildly dilated. 4. The mitral valve is normal in structure. Trivial mitral valve regurgitation. No evidence of mitral stenosis. 5. The aortic valve is tricuspid. There is mild calcification of the aortic valve. Aortic valve regurgitation is not visualized. No aortic stenosis is present. 6. The inferior vena cava is normal in size with greater than 50% respiratory variability, suggesting right atrial pressure of 3 mmHg.  FINDINGS Left Ventricle: Left  ventricular ejection fraction, by estimation, is 55 to 60%. The left ventricle has normal function. The left ventricle has no regional wall motion abnormalities. The left ventricular internal cavity size was normal in size. There is no left ventricular hypertrophy. Left ventricular diastolic parameters were normal.  Right Ventricle: The right ventricular size is normal. No increase in right ventricular wall thickness. Right ventricular systolic function is normal.  Left Atrium: Left atrial size was mildly dilated.  Right Atrium: Right atrial size was normal in size.  Pericardium: There is no evidence of pericardial effusion.  Mitral Valve: The mitral valve is normal in structure. Trivial mitral valve regurgitation. No evidence of mitral valve stenosis.  Tricuspid Valve: The tricuspid valve is normal in structure. Tricuspid valve regurgitation is trivial. No evidence of tricuspid stenosis.  Aortic Valve: The aortic valve is tricuspid. There is mild calcification of the aortic valve. Aortic valve regurgitation is not visualized. No aortic stenosis is present.  Pulmonic Valve: The pulmonic valve was normal in structure. Pulmonic valve regurgitation is trivial. No evidence of pulmonic stenosis.  Aorta: The aortic root is normal in size and structure.  Venous: The inferior vena cava is normal in size with greater than 50% respiratory variability, suggesting right atrial pressure of 3 mmHg.  IAS/Shunts: No atrial level shunt detected by color flow Doppler.   LEFT VENTRICLE PLAX 2D LVIDd:         4.50 cm   Diastology LVIDs:         3.10 cm   LV e' medial:    9.74 cm/s LV PW:         0.70 cm   LV E/e' medial:  9.0 LV IVS:        0.70 cm   LV e' lateral:   13.10 cm/s LVOT diam:     2.00 cm   LV E/e' lateral: 6.7 LV SV:         68 LV SV Index:   36        2D Longitudinal Strain LVOT Area:     3.14 cm  2D Strain GLS (A2C):   -26.1 % 2D Strain GLS (A3C):   -26.1 % 2D Strain GLS (A4C):    -26.6 % 2D Strain GLS Avg:     -26.3 %  3D Volume EF: 3D EF:        56 % LV EDV:       132 ml LV ESV:       58 ml LV SV:        74 ml  RIGHT VENTRICLE             IVC RV Basal diam:  3.20 cm     IVC diam: 2.00 cm RV S prime:     12.50 cm/s TAPSE (M-mode): 2.3 cm  LEFT ATRIUM  Index        RIGHT ATRIUM           Index LA diam:        4.20 cm 2.25 cm/m   RA Area:     12.60 cm LA Vol (A2C):   45.4 ml 24.27 ml/m  RA Volume:   29.00 ml  15.50 ml/m LA Vol (A4C):   39.8 ml 21.28 ml/m LA Biplane Vol: 42.6 ml 22.77 ml/m AORTIC VALVE LVOT Vmax:   114.00 cm/s LVOT Vmean:  72.033 cm/s LVOT VTI:    0.217 m  AORTA Ao Root diam: 2.90 cm Ao Asc diam:  3.10 cm  MITRAL VALVE MV Area (PHT): 3.36 cm    SHUNTS MV Decel Time: 226 msec    Systemic VTI:  0.22 m MV E velocity: 88.00 cm/s  Systemic Diam: 2.00 cm MV A velocity: 71.30 cm/s MV E/A ratio:  1.23  Arvilla Meres MD Electronically signed by Arvilla Meres MD Signature Date/Time: 07/03/2022/5:20:06 PM    Final    MONITORS  LONG TERM MONITOR (3-14 DAYS) 04/22/2022  Narrative   Normal sinus rhythm with average HR 83 bpm   No significant arrthythmias   Patch Wear Time:  6 days and 17 hours (2023-11-19T19:19:38-0500 to 2023-11-26T12:37:56-0500)  Patient had a min HR of 46 bpm, max HR of 160 bpm, and avg HR of 83 bpm. Predominant underlying rhythm was Sinus Rhythm. Slight P wave morphology changes were noted. Isolated SVEs were rare (<1.0%), and no SVE Couplets or SVE Triplets were present. Isolated VEs were rare (<1.0%), and no VE Couplets or VE Triplets were present.   CT SCANS  CT CARDIAC SCORING (SELF PAY ONLY) 04/15/2022  Addendum 04/16/2022  8:43 AM ADDENDUM REPORT: 04/16/2022 08:40  EXAM: OVER-READ INTERPRETATION  CT CHEST  The following report is an over-read performed by radiologist Dr. Darlys Gales [facility] on 04/16/2022. This over-read does not include interpretation of  cardiac or coronary anatomy or pathology. The coronary calcium score interpretation by the cardiologist is attached.  COMPARISON:  None.  FINDINGS: Vascular: No significant extracardiac vascular findings.  Mediastinum/Nodes: No lymphadenopathy.  Lungs/Pleura: No focal airspace disease. No suspicious pulmonary nodules.  Upper Abdomen: No acute abnormality.  Musculoskeletal: No acute osseous abnormality. No suspicious osseous lesion.  IMPRESSION: No significant extracardiac findings in the chest.   Electronically Signed By: Caprice Renshaw M.D. On: 04/16/2022 08:40  Narrative CLINICAL DATA:  Cardiovascular Disease Risk stratification  EXAM: Coronary Calcium Score  TECHNIQUE: A gated, non-contrast computed tomography scan of the heart was performed using 3mm slice thickness. Axial images were analyzed on a dedicated workstation. Calcium scoring of the coronary arteries was performed using the Agatston method.  FINDINGS: Coronary arteries: Normal origins.  Coronary Calcium Score:  Left main: 0  Left anterior descending artery: 226  Left circumflex artery: 38  Right coronary artery: 4  Total: 268  Percentile: 99  Pericardium: Normal.  Ascending Aorta: Normal caliber.  Non-cardiac: See separate report from [facility].  IMPRESSION: Coronary calcium score of 268. This was 22 percentile for age-, race-, and sex-matched controls.  RECOMMENDATIONS: Coronary artery calcium (CAC) score is a strong predictor of incident coronary heart disease (CHD) and provides predictive information beyond traditional risk factors. CAC scoring is reasonable to use in the decision to withhold, postpone, or initiate statin therapy in intermediate-risk or selected borderline-risk asymptomatic adults (age 64-75 years and LDL-C >=70 to <190 mg/dL) who do not have diabetes or established atherosclerotic cardiovascular disease (ASCVD).*  In intermediate-risk (10-year  ASCVD risk >=7.5% to <20%) adults or selected borderline-risk (10-year ASCVD risk >=5% to <7.5%) adults in whom a CAC score is measured for the purpose of making a treatment decision the following recommendations have been made:  If CAC=0, it is reasonable to withhold statin therapy and reassess in 5 to 10 years, as long as higher risk conditions are absent (diabetes mellitus, family history of premature CHD in first degree relatives (males <55 years; females <65 years), cigarette smoking, or LDL >=190 mg/dL).  If CAC is 1 to 99, it is reasonable to initiate statin therapy for patients >=64 years of age.  If CAC is >=100 or >=75th percentile, it is reasonable to initiate statin therapy at any age.  Cardiology referral should be considered for patients with CAC scores >=400 or >=75th percentile.  *2018 AHA/ACC/AACVPR/AAPA/ABC/ACPM/ADA/AGS/APhA/ASPC/NLA/PCNA Guideline on the Management of Blood Cholesterol: A Report of the American College of Cardiology/American Heart Association Task Force on Clinical Practice Guidelines. J Am Coll Cardiol. 2019;73(24):3168-3209.  Electronically Signed: By: Donato Schultz M.D. On: 04/15/2022 17:20     ______________________________________________________________________________________________      Risk Assessment/Calculations:            Physical Exam:   VS:  BP 118/74   Pulse 75   Ht 5' 4.5" (1.638 m)   Wt 197 lb (89.4 kg)   SpO2 98%   BMI 33.29 kg/m    Wt Readings from Last 3 Encounters:  07/13/23 197 lb (89.4 kg)  04/12/23 192 lb 8 oz (87.3 kg)  04/05/23 185 lb 3.2 oz (84 kg)    GEN: Well nourished, well developed in no acute distress NECK: No JVD; No carotid bruits CARDIAC: RRR, no murmurs, rubs, gallops RESPIRATORY:  Clear to auscultation without rales, wheezing or rhonchi  ABDOMEN: Soft, non-tender, non-distended EXTREMITIES:  No edema; No deformity   ASSESSMENT AND PLAN: .     SOB - Intermittent shortness of breath  at rest or with activity. Episodes are infrequent and atypical for angina. No interruption in GDMT for CAD. Encouraged gradual increase in activity as stress, deconditioning likely contributory. Will plan for 3 mos follow up to reassess. Discussed ways to increase activity, as below.   CAD 06/2022 DES-LAD / HLD, LDL goal <70 - Stable with no anginal symptoms. No indication for ischemic evaluation.  GDMT aspirin, toprol, rosuvastatin. Heart healthy diet and regular cardiovascular exercise encouraged.     BMI 33 / Prediabetes - Weight loss via diet and exercise encouraged. Discussed the impact being overweight would have on cardiovascular risk. Lengthy discussion today. Unfortunately, insurance does not cover weight loss medications such as GLP1. Has good aerobic workout with walking. Discussed adding strength training. Prefers to workout at home. Discussed exercise videos such as Fabulous 50s on Youtube. Additionally given information on [employer].    Palpitations - prior monitor with no significant arrhythmias. Continue Toprol 25mg  QD. May take additional 12.5mg  for breakthrough palpitations.  Tobacco use - Chewing nicotine gum. Discussed general reduction. Discussed Wellbutrin one week prior to quit date - she is interested. Start Wellbutrin 150mg  every day x 3 days then increase to 150mg  BID. Plan to continue for at least 3 months.            Dispo: follow up in 3 mos  Signed, Alver Sorrow, NP

## 2023-07-13 NOTE — Patient Instructions (Addendum)
 Medication Instructions:  Your physician has recommended you make the following change in your medication:   Wellbutrin 150mg  daily for three days and then twice daily   Follow-Up: At [facility], you and your health needs are our priority.  As part of our continuing mission to provide you with exceptional heart care, we have created designated Provider Care Teams.  These Care Teams include your primary Cardiologist (physician) and Advanced Practice Providers (APPs -  Physician Assistants and Nurse Practitioners) who all work together to provide you with the care you need, when you need it.  We recommend signing up for the patient portal called "MyChart".  Sign up information is provided on this After Visit Summary.  MyChart is used to connect with patients for Virtual Visits (Telemedicine).  Patients are able to view lab/test results, encounter notes, upcoming appointments, etc.  Non-urgent messages can be sent to your provider as well.   To learn more about what you can do with MyChart, go to [email or web address].    Your next appointment:   Please follow up in May with Dr. Duke Salvia or Gillian Shields, NP    Other Instructions Fabulous [phone] YouTube videos  [facility] [address] [place], [place] [address] Call: [phone] Our Hours Monday-Thursday 7:00 am - 5:00 pm Friday: Closed  [facility] [place] [address], [address] [place], [place] [address] Call us Call: [phone]  Fax: [phone] Our Hours Monday-Friday 7:00 am - 5:00 pm

## 2023-07-14 ENCOUNTER — Encounter (HOSPITAL_BASED_OUTPATIENT_CLINIC_OR_DEPARTMENT_OTHER): Payer: Self-pay

## 2023-07-15 ENCOUNTER — Other Ambulatory Visit (HOSPITAL_COMMUNITY): Payer: Self-pay

## 2023-07-15 ENCOUNTER — Telehealth: Payer: Self-pay | Admitting: Pharmacy Technician

## 2023-07-15 NOTE — Telephone Encounter (Signed)
 Please assist with PA, thanks!

## 2023-07-15 NOTE — Telephone Encounter (Signed)
 Ran test claim for bupropion. For a 30 day supply and the co-pay is 11.32 . No prior authorization needed. I called [facility] and they reprocessed   This test claim was processed through [facility]- copay amounts may vary at other pharmacies due to pharmacy/plan contracts, or as the patient moves through the different stages of their insurance plan.

## 2023-07-17 ENCOUNTER — Encounter (HOSPITAL_BASED_OUTPATIENT_CLINIC_OR_DEPARTMENT_OTHER): Payer: Self-pay | Admitting: Family

## 2023-10-19 ENCOUNTER — Ambulatory Visit (HOSPITAL_BASED_OUTPATIENT_CLINIC_OR_DEPARTMENT_OTHER): Payer: BC Managed Care – PPO | Admitting: Family

## 2023-11-15 ENCOUNTER — Encounter (HOSPITAL_BASED_OUTPATIENT_CLINIC_OR_DEPARTMENT_OTHER): Payer: Self-pay

## 2023-11-15 ENCOUNTER — Encounter: Payer: Self-pay | Admitting: Family Medicine

## 2023-11-15 DIAGNOSIS — M62838 Other muscle spasm: Secondary | ICD-10-CM

## 2023-11-15 DIAGNOSIS — Z720 Tobacco use: Secondary | ICD-10-CM

## 2023-11-15 MED ORDER — CYCLOBENZAPRINE HCL 10 MG PO TABS
10.0000 mg | ORAL_TABLET | Freq: Every day | ORAL | 0 refills | Status: DC
Start: 2023-11-15 — End: 2024-04-10

## 2023-11-15 MED ORDER — BUPROPION HCL ER (SR) 150 MG PO TB12: 150.0000 mg | ORAL_TABLET | Freq: Two times a day (BID) | ORAL | 0 refills | Status: DC

## 2023-11-15 MED ORDER — METHOCARBAMOL 500 MG PO TABS: 500.0000 mg | ORAL_TABLET | Freq: Two times a day (BID) | ORAL | 0 refills | Status: DC

## 2023-11-15 NOTE — Telephone Encounter (Signed)
 Pt following up

## 2023-11-18 DIAGNOSIS — Z01411 Encounter for gynecological examination (general) (routine) with abnormal findings: Secondary | ICD-10-CM | POA: Diagnosis not present

## 2023-11-18 DIAGNOSIS — Z6833 Body mass index (BMI) 33.0-33.9, adult: Secondary | ICD-10-CM | POA: Diagnosis not present

## 2023-11-18 DIAGNOSIS — Z01419 Encounter for gynecological examination (general) (routine) without abnormal findings: Secondary | ICD-10-CM | POA: Diagnosis not present

## 2023-12-21 ENCOUNTER — Ambulatory Visit (HOSPITAL_BASED_OUTPATIENT_CLINIC_OR_DEPARTMENT_OTHER): Admitting: Family

## 2023-12-21 VITALS — BP 99/67 | HR 59 | Ht 65.0 in | Wt 198.0 lb

## 2023-12-21 DIAGNOSIS — E785 Hyperlipidemia, unspecified: Secondary | ICD-10-CM | POA: Diagnosis not present

## 2023-12-21 DIAGNOSIS — Z720 Tobacco use: Secondary | ICD-10-CM

## 2023-12-21 DIAGNOSIS — I25118 Atherosclerotic heart disease of native coronary artery with other forms of angina pectoris: Secondary | ICD-10-CM

## 2023-12-21 MED ORDER — BUPROPION HCL ER (SR) 150 MG PO TB12: 150.0000 mg | ORAL_TABLET | Freq: Two times a day (BID) | ORAL | 1 refills | Status: DC

## 2023-12-21 NOTE — Progress Notes (Signed)
 Cardiology Office Note:  .   Date:  12/21/2023  ID:  Wendy Tate, DOB 08/22/1975, MRN [id number] PCP: Watt Harlene BROCKS, MD  [facility] Providers Cardiologist:  Annabella Scarce, MD    History of Present Illness: .   Wendy Tate is a 48 y.o. female with a hx of CAD (06/22/22 DES-LAD), prediabetes, palpitations, HLD last seen 07/06/22.  Saw PCP 03/2022 with palpitations. Subsequent monitor 04/2022 predominantly NSR and no significant arrhythmia. Coronary calcium  score 268 placing her in the 99th percentile for age/race/sex matched controls. Referred to cardiology with [facility] 06/22/22 severe prox LAD 85% stenosis treated with DES with residual 0% stenosis. Recommended for DAPT aspirin /plavix  x 6 months then Plavix  monotherapy. Echo 07/03/22 normal LVEF 55-60%, trivial MR.  At visit 09/2022, Toprol  adjusted to 12.5mg  daily with additional 12.5mg  PRN for breakthrough palpitations.   Last seen 03/2023. Toprol  increased to 25mg  daily. Prior authorization attempted for Wegovy  submitted, but not approved.   At visit 06/23/23 started on Wellbutrin  for smoking cessation. She was encouraged to gradually increase activity to help with exertional dyspnea.   Presents today for follow up. Notes Wellbutrin  has helped with mood and also decreased nicotine gum use. Previously whole sleever per day, most days half sleeve. She notes persistent exertional dyspnea similar to previous. Though has not significantly increased activity, does not feel her dyspnea has improved when she has increased activity. No chest pain, edema, palpitations.   ROS: Please see the history of present illness.    All other systems reviewed and are negative.   Studies Reviewed: .          Cardiac Studies & Procedures   ______________________________________________________________________________________________ CARDIAC CATHETERIZATION  CARDIAC CATHETERIZATION 06/22/2022  Conclusion   CULPRIT LESION: Prox LAD lesion is 85%  stenosed.  RFR measurement 0.87-significant.   A drug-eluting stent SYNERGY XD 2.75X16 was placed-deployed to 2.9 mm and postdilated to 3.0 mm (OCT guided).  Post intervention, there is a 0% residual stenosis.   --------------------------------   Lida Cx to Prox Cx lesion is 15% stenosed.   --------------------------------   The left ventricular systolic function is normal.  The left ventricular ejection fraction is 55-65% by visual estimate. LV end diastolic pressure is normal.   There is no aortic valve stenosis.  POST-CATH DIAGNOSES Severe single-vessel disease with ostial and proximal LAD the 85% stenosis (RFR 0.87) -> Successful DES PCI using Synergy XD 2.75 mm x 16 mm postdilated to 3.0 mm by OCT guidance. Lesion reduced to 0%, TIMI-3 flow pre and post. Otherwise minimal disease. Normal LVEF and LVEDP.   RECOMMENDATIONS Successful Mynx closure device Left Femoral access and TR band placement on radial access. Significant Right Radial Loop, if further catheterization is recommended would consider Right Ulnar Artery. DAPT per recommendations section If stable okay to discharge after 8 hours.    Alm Clay, MD  Findings Coronary Findings Diagnostic  Dominance: Right  Left Main Vessel was injected. Vessel is normal in caliber.  Left Anterior Descending The vessel exhibits minimal luminal irregularities. Prox LAD lesion is 85% stenosed. Vessel is the culprit lesion. The lesion is type B1, located proximal to the major branch, eccentric and irregular. The lesion is mildly calcified. The stenosis was measured by a visual reading. Pressure wire/FFR was performed on the lesion. RFR 0.87-significant Optical coherence tomography (OCT) was performed. Minimum lumen area: 0.75 mm.  First Diagonal Branch Vessel is angiographically normal.  First Septal Branch Vessel is small in size.  Second Biomedical scientist  is angiographically normal.  Second Septal Branch Vessel is  small in size.  Ramus Intermedius Vessel is small.  Left Circumflex There is mild focal disease in the vessel. The vessel exhibits minimal luminal irregularities. Ost Cx to Prox Cx lesion is 15% stenosed.  First Obtuse Marginal Branch Vessel is small in size. Vessel is angiographically normal.  Second Obtuse Marginal Branch Vessel is small in size. Vessel is angiographically normal.  Fourth Obtuse Marginal Branch Vessel is small in size. Vessel is angiographically normal.  Right Coronary Artery Vessel was injected. Vessel is Moderate in size Vessel is angiographically normal.  Acute Marginal Branch Vessel is small in size.  Right Ventricular Branch Vessel is small in size.  Right Posterior Descending Artery Vessel is small in size.  First Right Posterolateral Branch Vessel is small in size.  Intervention  Prox LAD lesion Stent Lesion length:  15 mm. CATH DRAGONFLY OPSTAR guide catheter was inserted. Lesion crossed with guidewire using a GUIDEWIRE PRESSURE X 175. Pre-stent angioplasty was performed using a BALLN EMERGE MR 2.5X15. Maximum pressure:  12 atm. Inflation time:  20 sec. A drug-eluting stent was successfully placed using a SYNERGY XD 2.75X16. Maximum pressure: 16 atm. Inflation time: 30 sec. Stent strut is well apposed. Post-stent angioplasty was performed using a BALL SAPPHIRE NC24 3.0X12. Maximum pressure:  14 atm. Inflation time:  20 sec. Postdilated to 3.0 mm-size by OCT Initially with a pressure X wire across the lesion, RFR was measured was 0.87.  Significant.  This was followed by OCT using DRAGONFLY OPSTAR to size and length of the stent. Post-Intervention Lesion Assessment The intervention was successful. Pre-interventional TIMI flow is 3. Post-intervention TIMI flow is 3. Treated lesion length:  16 mm. No complications occurred at this lesion. There is a 0% residual stenosis post intervention.     ECHOCARDIOGRAM  ECHOCARDIOGRAM COMPLETE  07/03/2022  Narrative ECHOCARDIOGRAM REPORT    Patient Name:   Wendy Tate Date of Exam: 07/03/2022 Medical Rec #:  [id number]        Height:       64.0 in Accession #:    [id number]       Weight:       180.0 lb Date of Birth:  1975/06/09        BSA:          1.871 m Patient Age:    46 years         BP:           118/80 mmHg Patient Gender: F                HR:           77 bpm. Exam Location:  [address]  Procedure: 2D Echo, 3D Echo, Cardiac Doppler, Color Doppler and Strain Analysis  Indications:    R06.00 Dyspnea  History:        Patient has no prior history of Echocardiogram examinations. CAD; STENT. Palpitations.  Sonographer:    Carl Rodgers-Jones RDCS Referring Phys: [id number] TIFFANY [place]  IMPRESSIONS   1. Left ventricular ejection fraction, by estimation, is 55 to 60%. The left ventricle has normal function. The left ventricle has no regional wall motion abnormalities. Left ventricular diastolic parameters were normal. 2. Right ventricular systolic function is normal. The right ventricular size is normal. 3. Left atrial size was mildly dilated. 4. The mitral valve is normal in structure. Trivial mitral valve regurgitation. No evidence of mitral stenosis. 5. The aortic valve is tricuspid. There  is mild calcification of the aortic valve. Aortic valve regurgitation is not visualized. No aortic stenosis is present. 6. The inferior vena cava is normal in size with greater than 50% respiratory variability, suggesting right atrial pressure of 3 mmHg.  FINDINGS Left Ventricle: Left ventricular ejection fraction, by estimation, is 55 to 60%. The left ventricle has normal function. The left ventricle has no regional wall motion abnormalities. The left ventricular internal cavity size was normal in size. There is no left ventricular hypertrophy. Left ventricular diastolic parameters were normal.  Right Ventricle: The right ventricular size is normal. No increase in  right ventricular wall thickness. Right ventricular systolic function is normal.  Left Atrium: Left atrial size was mildly dilated.  Right Atrium: Right atrial size was normal in size.  Pericardium: There is no evidence of pericardial effusion.  Mitral Valve: The mitral valve is normal in structure. Trivial mitral valve regurgitation. No evidence of mitral valve stenosis.  Tricuspid Valve: The tricuspid valve is normal in structure. Tricuspid valve regurgitation is trivial. No evidence of tricuspid stenosis.  Aortic Valve: The aortic valve is tricuspid. There is mild calcification of the aortic valve. Aortic valve regurgitation is not visualized. No aortic stenosis is present.  Pulmonic Valve: The pulmonic valve was normal in structure. Pulmonic valve regurgitation is trivial. No evidence of pulmonic stenosis.  Aorta: The aortic root is normal in size and structure.  Venous: The inferior vena cava is normal in size with greater than 50% respiratory variability, suggesting right atrial pressure of 3 mmHg.  IAS/Shunts: No atrial level shunt detected by color flow Doppler.   LEFT VENTRICLE PLAX 2D LVIDd:         4.50 cm   Diastology LVIDs:         3.10 cm   LV e' medial:    9.74 cm/s LV PW:         0.70 cm   LV E/e' medial:  9.0 LV IVS:        0.70 cm   LV e' lateral:   13.10 cm/s LVOT diam:     2.00 cm   LV E/e' lateral: 6.7 LV SV:         68 LV SV Index:   36        2D Longitudinal Strain LVOT Area:     3.14 cm  2D Strain GLS (A2C):   -26.1 % 2D Strain GLS (A3C):   -26.1 % 2D Strain GLS (A4C):   -26.6 % 2D Strain GLS Avg:     -26.3 %  3D Volume EF: 3D EF:        56 % LV EDV:       132 ml LV ESV:       58 ml LV SV:        74 ml  RIGHT VENTRICLE             IVC RV Basal diam:  3.20 cm     IVC diam: 2.00 cm RV S prime:     12.50 cm/s TAPSE (M-mode): 2.3 cm  LEFT ATRIUM             Index        RIGHT ATRIUM           Index LA diam:        4.20 cm 2.25 cm/m   RA Area:      12.60 cm LA Vol (A2C):   45.4 ml 24.27 ml/m  RA Volume:  29.00 ml  15.50 ml/m LA Vol (A4C):   39.8 ml 21.28 ml/m LA Biplane Vol: 42.6 ml 22.77 ml/m AORTIC VALVE LVOT Vmax:   114.00 cm/s LVOT Vmean:  72.033 cm/s LVOT VTI:    0.217 m  AORTA Ao Root diam: 2.90 cm Ao Asc diam:  3.10 cm  MITRAL VALVE MV Area (PHT): 3.36 cm    SHUNTS MV Decel Time: 226 msec    Systemic VTI:  0.22 m MV E velocity: 88.00 cm/s  Systemic Diam: 2.00 cm MV A velocity: 71.30 cm/s MV E/A ratio:  1.23  Toribio Fuel MD Electronically signed by Toribio Fuel MD Signature Date/Time: 07/03/2022/5:20:06 PM    Final    MONITORS  LONG TERM MONITOR (3-14 DAYS) 04/22/2022  Narrative   Normal sinus rhythm with average HR 83 bpm   No significant arrthythmias   Patch Wear Time:  6 days and 17 hours (2023-11-19T19:19:38-0500 to 2023-11-26T12:37:56-0500)  Patient had a min HR of 46 bpm, max HR of 160 bpm, and avg HR of 83 bpm. Predominant underlying rhythm was Sinus Rhythm. Slight P wave morphology changes were noted. Isolated SVEs were rare (<1.0%), and no SVE Couplets or SVE Triplets were present. Isolated VEs were rare (<1.0%), and no VE Couplets or VE Triplets were present.   CT SCANS  CT CARDIAC SCORING (SELF PAY ONLY) 04/15/2022  Addendum 04/16/2022  8:43 AM ADDENDUM REPORT: 04/16/2022 08:40  EXAM: OVER-READ INTERPRETATION  CT CHEST  The following report is an over-read performed by radiologist Dr. Jacob [facility] on 04/16/2022. This over-read does not include interpretation of cardiac or coronary anatomy or pathology. The coronary calcium  score interpretation by the cardiologist is attached.  COMPARISON:  None.  FINDINGS: Vascular: No significant extracardiac vascular findings.  Mediastinum/Nodes: No lymphadenopathy.  Lungs/Pleura: No focal airspace disease. No suspicious pulmonary nodules.  Upper Abdomen: No acute abnormality.  Musculoskeletal: No  acute osseous abnormality. No suspicious osseous lesion.  IMPRESSION: No significant extracardiac findings in the chest.   Electronically Signed By: Lang Sprinkles M.D. On: 04/16/2022 08:40  Narrative CLINICAL DATA:  Cardiovascular Disease Risk stratification  EXAM: Coronary Calcium  Score  TECHNIQUE: A gated, non-contrast computed tomography scan of the heart was performed using 3mm slice thickness. Axial images were analyzed on a dedicated workstation. Calcium  scoring of the coronary arteries was performed using the Agatston method.  FINDINGS: Coronary arteries: Normal origins.  Coronary Calcium  Score:  Left main: 0  Left anterior descending artery: 226  Left circumflex artery: 38  Right coronary artery: 4  Total: 268  Percentile: 99  Pericardium: Normal.  Ascending Aorta: Normal caliber.  Non-cardiac: See separate report from [facility].  IMPRESSION: Coronary calcium  score of 268. This was 13 percentile for age-, race-, and sex-matched controls.  RECOMMENDATIONS: Coronary artery calcium  (CAC) score is a strong predictor of incident coronary heart disease (CHD) and provides predictive information beyond traditional risk factors. CAC scoring is reasonable to use in the decision to withhold, postpone, or initiate statin therapy in intermediate-risk or selected borderline-risk asymptomatic adults (age 84-75 years and LDL-C >=70 to <190 mg/dL) who do not have diabetes or established atherosclerotic cardiovascular disease (ASCVD).* In intermediate-risk (10-year ASCVD risk >=7.5% to <20%) adults or selected borderline-risk (10-year ASCVD risk >=5% to <7.5%) adults in whom a CAC score is measured for the purpose of making a treatment decision the following recommendations have been made:  If CAC=0, it is reasonable to withhold statin therapy and reassess in 5 to 10 years, as  long as higher risk conditions are absent (diabetes mellitus, family history  of premature CHD in first degree relatives (males <55 years; females <65 years), cigarette smoking, or LDL >=190 mg/dL).  If CAC is 1 to 99, it is reasonable to initiate statin therapy for patients >=69 years of age.  If CAC is >=100 or >=75th percentile, it is reasonable to initiate statin therapy at any age.  Cardiology referral should be considered for patients with CAC scores >=400 or >=75th percentile.  *2018 AHA/ACC/AACVPR/AAPA/ABC/ACPM/Tate/AGS/APhA/ASPC/NLA/PCNA Guideline on the Management of Blood Cholesterol: A Report of the American College of Cardiology/American Heart Association Task Force on Clinical Practice Guidelines. J Am Coll Cardiol. 2019;73(24):3168-3209.  Electronically Signed: By: Oneil Parchment M.D. On: 04/15/2022 17:20     ______________________________________________________________________________________________      Risk Assessment/Calculations:            Physical Exam:   VS:  BP 99/67 (BP Location: Left Arm)   Pulse (!) 59   Ht 5' 5 (1.651 m)   Wt 198 lb (89.8 kg)   SpO2 99%   BMI 32.95 kg/m    Wt Readings from Last 3 Encounters:  12/21/23 198 lb (89.8 kg)  07/13/23 197 lb (89.4 kg)  04/12/23 192 lb 8 oz (87.3 kg)    GEN: Well nourished, well developed in no acute distress NECK: No JVD; No carotid bruits CARDIAC: RRR, no murmurs, rubs, gallops RESPIRATORY:  Clear to auscultation without rales, wheezing or rhonchi  ABDOMEN: Soft, non-tender, non-distended EXTREMITIES:  No edema; No deformity   ASSESSMENT AND PLAN: .    CAD 06/2022 DES-LAD / HLD, LDL goal <70 - Due to persistent exertional dyspnea, plan for lexiscan myoview to rule out ischemia.SABRA  GDMT aspirin , toprol , rosuvastatin . Heart healthy diet and regular cardiovascular exercise encouraged.     BMI 32 / Prediabetes - Weight loss via diet and exercise encouraged. Discussed the impact being overweight would have on cardiovascular risk.SABRA Unfortunately, insurance does not cover  weight loss medications such as GLP1. Has good aerobic workout with walking. Has also started some exercise videos on Youtube.  Palpitations - prior monitor with no significant arrhythmias. No recurrent palpitations. Continue Toprol  25mg  QD. May take additional 12.5mg  for breakthrough palpitations.  Tobacco use - Chewing nicotine gum. Discussed general reduction. Continue Wellbutrin  150mg  BID. Will provide 90 day refills. As notes improvement in mood with Wellbutrin , future refills per primary care at upcoming annual visit.            Dispo: follow up in 3 mos  Signed, Reche GORMAN Finder, NP

## 2023-12-21 NOTE — Patient Instructions (Signed)
 Medication Instructions:  NO CHANGES  Lab Work: NONE TO BE DONE TODAY.   Testing/Procedures: Your physician has requested that you have a lexiscan myoview. For further information please visit [email or web address] Please follow instruction sheet, as given.   Follow-Up: At [facility], you and your health needs are our priority.  As part of our continuing mission to provide you with exceptional heart care, our providers are all part of one team.  This team includes your primary Cardiologist (physician) and Advanced Practice Providers or APPs (Physician Assistants and Nurse Practitioners) who all work together to provide you with the care you need, when you need it.  Your next appointment:   6 MONTHS  Provider:   Reche Finder, NP

## 2023-12-26 ENCOUNTER — Encounter (HOSPITAL_BASED_OUTPATIENT_CLINIC_OR_DEPARTMENT_OTHER): Payer: Self-pay | Admitting: Family

## 2023-12-30 ENCOUNTER — Encounter (HOSPITAL_COMMUNITY): Payer: Self-pay | Admitting: *Deleted

## 2024-01-06 ENCOUNTER — Other Ambulatory Visit (HOSPITAL_BASED_OUTPATIENT_CLINIC_OR_DEPARTMENT_OTHER): Payer: Self-pay | Admitting: Family

## 2024-01-06 DIAGNOSIS — I25118 Atherosclerotic heart disease of native coronary artery with other forms of angina pectoris: Secondary | ICD-10-CM

## 2024-01-06 DIAGNOSIS — E785 Hyperlipidemia, unspecified: Secondary | ICD-10-CM

## 2024-01-06 DIAGNOSIS — Z720 Tobacco use: Secondary | ICD-10-CM

## 2024-01-10 ENCOUNTER — Ambulatory Visit (HOSPITAL_COMMUNITY)
Admission: RE | Admit: 2024-01-10 | Discharge: 2024-01-10 | Disposition: A | Discharge status: Home / Self Care | Source: Ambulatory Visit | Attending: Cardiology | Admitting: Cardiology

## 2024-01-10 DIAGNOSIS — E785 Hyperlipidemia, unspecified: Secondary | ICD-10-CM | POA: Insufficient documentation

## 2024-01-10 DIAGNOSIS — I25118 Atherosclerotic heart disease of native coronary artery with other forms of angina pectoris: Secondary | ICD-10-CM | POA: Diagnosis not present

## 2024-01-10 DIAGNOSIS — Z720 Tobacco use: Secondary | ICD-10-CM | POA: Insufficient documentation

## 2024-01-10 MED ORDER — TECHNETIUM TC 99M TETROFOSMIN IV KIT
32.7000 | PACK | Freq: Once | INTRAVENOUS | Status: AC | PRN
  Administered 2024-01-10: 32.7 via INTRAVENOUS

## 2024-01-10 MED ORDER — TECHNETIUM TC 99M TETROFOSMIN IV KIT
10.4000 | PACK | Freq: Once | INTRAVENOUS | Status: AC | PRN
  Administered 2024-01-10: 10.4 via INTRAVENOUS

## 2024-01-11 ENCOUNTER — Ambulatory Visit (HOSPITAL_BASED_OUTPATIENT_CLINIC_OR_DEPARTMENT_OTHER): Payer: Self-pay | Admitting: Family

## 2024-01-11 LAB — MYOCARDIAL PERFUSION IMAGING
Angina Index: 0
Duke Treadmill Score: 9
Estimated workload: 10.1
Exercise duration (min): 9 min
LV dias vol: 90 mL (ref 46–106)
LV sys vol: 23 mL (ref 3.8–5.2)
MPHR: 171 {beats}/min
Nuc Stress EF: 74 %
Peak HR: 151 {beats}/min
Percent HR: 88 %
Rest HR: 55 {beats}/min
Rest Nuclear Isotope Dose: 10.4 mCi
SDS: 2
SRS: 3
SSS: 4
ST Depression (mm): 0 mm
Stress Nuclear Isotope Dose: 32.7 mCi
TID: 0.95

## 2024-01-17 ENCOUNTER — Emergency Department (HOSPITAL_COMMUNITY)
Admission: EM | Admit: 2024-01-17 | Discharge: 2024-01-17 | Disposition: A | Discharge status: Home / Self Care | Attending: Emergency Medicine | Admitting: Emergency Medicine

## 2024-01-17 ENCOUNTER — Emergency Department (HOSPITAL_COMMUNITY)

## 2024-01-17 ENCOUNTER — Encounter (HOSPITAL_COMMUNITY): Payer: Self-pay

## 2024-01-17 ENCOUNTER — Other Ambulatory Visit: Payer: Self-pay

## 2024-01-17 DIAGNOSIS — Z955 Presence of coronary angioplasty implant and graft: Secondary | ICD-10-CM | POA: Diagnosis not present

## 2024-01-17 DIAGNOSIS — K625 Hemorrhage of anus and rectum: Secondary | ICD-10-CM | POA: Diagnosis not present

## 2024-01-17 DIAGNOSIS — K529 Noninfective gastroenteritis and colitis, unspecified: Secondary | ICD-10-CM | POA: Diagnosis not present

## 2024-01-17 DIAGNOSIS — I2510 Atherosclerotic heart disease of native coronary artery without angina pectoris: Secondary | ICD-10-CM | POA: Insufficient documentation

## 2024-01-17 DIAGNOSIS — Z7982 Long term (current) use of aspirin: Secondary | ICD-10-CM | POA: Diagnosis not present

## 2024-01-17 DIAGNOSIS — R109 Unspecified abdominal pain: Secondary | ICD-10-CM | POA: Diagnosis not present

## 2024-01-17 DIAGNOSIS — N200 Calculus of kidney: Secondary | ICD-10-CM | POA: Diagnosis not present

## 2024-01-17 DIAGNOSIS — K921 Melena: Secondary | ICD-10-CM | POA: Diagnosis not present

## 2024-01-17 LAB — COMPREHENSIVE METABOLIC PANEL WITH GFR
ALT: 27 U/L (ref 0–44)
AST: 20 U/L (ref 15–41)
Albumin: 4.7 g/dL (ref 3.5–5.0)
Alkaline Phosphatase: 85 U/L (ref 38–126)
Anion gap: 13 (ref 5–15)
BUN: 6 mg/dL (ref 6–20)
CO2: 23 mmol/L (ref 22–32)
Calcium: 9.6 mg/dL (ref 8.9–10.3)
Chloride: 104 mmol/L (ref 98–111)
Creatinine, Ser: 0.74 mg/dL (ref 0.44–1.00)
GFR, Estimated: >60 mL/min (ref >60)
Glucose, Bld: 101 mg/dL — ABNORMAL HIGH (ref 70–99)
Potassium: 3.4 mmol/L — ABNORMAL LOW (ref 3.5–5.1)
Sodium: 140 mmol/L (ref 135–145)
Total Bilirubin: 0.4 mg/dL (ref 0.0–1.2)
Total Protein: 7.1 g/dL (ref 6.5–8.1)

## 2024-01-17 LAB — CBC
HCT: 45.2 % (ref 36.0–46.0)
Hemoglobin: 14.6 g/dL (ref 12.0–15.0)
MCH: 30.7 pg (ref 26.0–34.0)
MCHC: 32.3 g/dL (ref 30.0–36.0)
MCV: 95.2 fL (ref 80.0–100.0)
Platelets: 275 K/uL (ref 150–400)
RBC: 4.75 MIL/uL (ref 3.87–5.11)
RDW: 12.2 % (ref 11.5–15.5)
WBC: 10.3 K/uL (ref 4.0–10.5)
nRBC: 0 % (ref 0.0–0.2)

## 2024-01-17 LAB — URINALYSIS, ROUTINE W REFLEX MICROSCOPIC
Bilirubin Urine: NEGATIVE
Glucose, UA: NEGATIVE mg/dL
Ketones, ur: 5 mg/dL — AB
Leukocytes,Ua: NEGATIVE
Nitrite: NEGATIVE
Protein, ur: NEGATIVE mg/dL
Specific Gravity, Urine: 1.009 (ref 1.005–1.030)
pH: 5 (ref 5.0–8.0)

## 2024-01-17 LAB — POC OCCULT BLOOD, ED: Fecal Occult Bld: POSITIVE — AB

## 2024-01-17 LAB — HCG, SERUM, QUALITATIVE: Preg, Serum: NEGATIVE

## 2024-01-17 LAB — LIPASE, BLOOD: Lipase: 17 U/L (ref 11–51)

## 2024-01-17 MED ORDER — AMOXICILLIN-POT CLAVULANATE 875-125 MG PO TABS
1.0000 | ORAL_TABLET | Freq: Once | ORAL | Status: AC
  Administered 2024-01-17: 1 via ORAL
  Filled 2024-01-17: qty 1

## 2024-01-17 MED ORDER — DICYCLOMINE HCL 10 MG/ML IM SOLN
20.0000 mg | Freq: Once | INTRAMUSCULAR | Status: AC
  Administered 2024-01-17: 20 mg via INTRAMUSCULAR
  Filled 2024-01-17: qty 2

## 2024-01-17 MED ORDER — AMOXICILLIN-POT CLAVULANATE 875-125 MG PO TABS: 1.0000 | ORAL_TABLET | Freq: Two times a day (BID) | ORAL | 0 refills | Status: DC

## 2024-01-17 MED ORDER — POTASSIUM CHLORIDE 20 MEQ PO PACK
40.0000 meq | PACK | Freq: Once | ORAL | Status: AC
  Administered 2024-01-17: 40 meq via ORAL

## 2024-01-17 MED ORDER — MAGNESIUM OXIDE -MG SUPPLEMENT 400 (240 MG) MG PO TABS
400.0000 mg | ORAL_TABLET | Freq: Once | ORAL | Status: AC
  Administered 2024-01-17: 400 mg via ORAL
  Filled 2024-01-17: qty 1

## 2024-01-17 MED ORDER — LACTATED RINGERS IV BOLUS
500.0000 mL | Freq: Once | INTRAVENOUS | Status: AC
  Administered 2024-01-17: 500 mL via INTRAVENOUS

## 2024-01-17 MED ORDER — POTASSIUM CHLORIDE 20 MEQ PO PACK
40.0000 meq | PACK | Freq: Two times a day (BID) | ORAL | Status: DC
  Filled 2024-01-17: qty 2

## 2024-01-17 NOTE — ED Provider Notes (Signed)
 [place] EMERGENCY DEPARTMENT AT [facility] Provider Note   CSN: [id number] Arrival date & time: 01/17/24  1606     History Chief Complaint  Patient presents with   Rectal Bleeding    HPI: Wendy Tate is a 48 y.o. female with history pertinent for CAD, prediabetes, palpitations, HLD who presents complaining of bloody diarrhea. Patient arrived via POV accompanied by wife, Marko.  History provided by patient and spouse/partner.  No interpreter required during this encounter.  Patient reports that on the evening of 8/31 she had dinner with her wife and friends.  Reports that she ate dinner and shortly thereafter developed nausea.  Went to the bathroom and had dry heaving, therefore induced vomiting via gag reflex.  Had 1 total episode of nonbloody emesis.  Shortly thereafter had to tenesmus and multiple episodes of normal diarrhea.  Reports that this continued throughout the evening, and subsequently diarrhea became intermixed with blood.  Reports that she also has abdominal cramping most prominent in her left lower quadrant.  Reports that she has blood in the toilet, as well as when wiping.  Denies known history of fissures or hemorrhoids.  Reports that she has previously had 1 prior episode of bloody diarrhea when taking diclofenac for a MSK injury remotely.  Endorses chills.  Denies fever, additional episodes of vomiting.  Denies chest pain or shortness of breath beyond her reported baseline.  Wife at bedside reports that she had the same meal as her wife last night and remained asymptomatic, as did several of their friends.  Patient reports that she has a history of likely contrast media allergy.  Reports that remotely after having titanium markers placed during a procedure that also had contrast, she developed a anaphylactic reaction later that evening with shortness of breath and urticaria.  Reports that additionally with her most recent cardiac cath and stenting she  developed urticaria during the procedures which resolved with Benadryl .  Patient's recorded medical, surgical, social, medication list and allergies were reviewed in the Snapshot window as part of the initial history.   Prior to Admission medications   Medication Sig Start Date End Date Taking? Authorizing Provider  acetaminophen  (TYLENOL ) 500 MG tablet Take 500-1,000 mg by mouth every 6 (six) hours as needed for moderate pain.    [provider]  Ascorbic Acid (VITAMIN C PO) Take 1 tablet by mouth daily.    [provider]  aspirin  EC 81 MG tablet Take 1 tablet (81 mg total) by mouth daily. Swallow whole. 06/22/22   Meng, Hao, PA  Biotin w/ Vitamins C & E (HAIR/SKIN/NAILS PO) Take by mouth daily at 6 (six) AM.    [provider]  buPROPion  (WELLBUTRIN  SR) 150 MG 12 hr tablet Take 1 tablet (150 mg total) by mouth 2 (two) times daily. 12/21/23 06/18/24  Vannie Reche RAMAN, NP  cholecalciferol (VITAMIN D3) 25 MCG (1000 UNIT) tablet Take 1,000 Units by mouth daily.    [provider]  Coenzyme Q10 (COQ10) 200 MG CAPS Take 200 mg by mouth daily.    [provider]  cyanocobalamin (VITAMIN B12) 1000 MCG tablet Take 1,000 mcg by mouth daily.    [provider]  cyclobenzaprine  (FLEXERIL ) 10 MG tablet Take 1 tablet (10 mg total) by mouth at bedtime. Use as needed for muscle pain. Use this or methocarbamol - not both 11/15/23   Copland, Harlene BROCKS, MD  methocarbamol  (ROBAXIN ) 500 MG tablet Take 1 tablet (500 mg total) by mouth 2 (two)  times daily. Use as needed for muscle spasm  use this or cyclobenzaprine - not both 11/15/23   Copland, Harlene BROCKS, MD  metoprolol  succinate (TOPROL -XL) 25 MG 24 hr tablet Take 1 tablet (25 mg total) by mouth at bedtime. May take additional tablet as needed for breakthrough palpitations. 04/12/23   Raford Riggs, MD  nicotine polacrilex (NICORETTE) 2 MG gum Take 2 mg by mouth as needed for smoking cessation.    [provider]  Omega-3 Fatty Acids (FISH OIL) 1000 MG CAPS Take 2,000 mg by mouth daily.    [provider]  Rhubarb (ESTROVEN MENOPAUSE RELIEF) 4 MG TABS Take by mouth daily at 6 (six) AM.    [provider]  rosuvastatin  (CRESTOR ) 20 MG tablet Take 1 tablet (20 mg total) by mouth daily. 04/05/23   Copland, Harlene BROCKS, MD     Allergies: Iodinated contrast media   Review of Systems   ROS as per HPI  Physical Exam Updated Vital Signs BP (!) 130/90 (BP Location: Left Arm)   Pulse 75   Temp 97.7 F (36.5 C) (Oral)   Resp 16   Ht 5' 4 (1.626 m)   Wt 88.5 kg   SpO2 100%   BMI 33.47 kg/m  Physical Exam  ED Course/ Medical Decision Making/ A&P    Procedures Procedures   Medications Ordered in ED Medications - No data to display  Medical Decision Making:   Wendy Tate is a 48 y.o. female who presents for ***as per above.  Physical exam is pertinent for ***.   The differential includes but is not limited to ***.  Independent historian: {LSHISTORIAN:33403}  External data reviewed: {LSEXTERNALDATA:33407}  Initial Plan:  ***  ***Screening labs including CBC and Metabolic panel to evaluate for infectious or metabolic etiology of disease.  ***Urinalysis with reflex culture ordered to evaluate for UTI or relevant urologic/nephrologic pathology.  ***CXR to evaluate for structural/infectious intrathoracic pathology.  {crccardiactesting:32591::EKG to evaluate for cardiac pathology} Objective evaluation as below reviewed   Labs: {LSLABS:33416}  Radiology: {ODMJID:66582} MYOCARDIAL PERFUSION/CT RAD READ Result Date: 01/13/2024 CLINICAL DATA:  This over-read does not include interpretation of cardiac or coronary anatomy or pathology. The cardiac SPECT CT interpretation by the cardiologist is attached. COMPARISON:  None Available. FINDINGS: Cardiovascular: No significant vascular findings. Normal heart size. Left coronary artery calcifications. No pericardial  effusion. Mediastinum/Nodes: No enlarged mediastinal, hilar, or axillary lymph nodes. Thymic remnant in the anterior mediastinum. Thyroid  gland, trachea, and esophagus demonstrate no significant findings. Lungs/Pleura: Lungs are clear. No pleural effusion or pneumothorax. Upper Abdomen: No acute abnormality. Musculoskeletal: No chest wall abnormality. No acute osseous findings. IMPRESSION: 1. No acute CT findings of the chest. 2. Coronary artery disease. Electronically Signed   By: Marolyn JONETTA Jaksch M.D.   On: 01/13/2024 14:21   Myocardial Perfusion Imaging Result Date: 01/11/2024   The study is normal. The study is low risk.   A Bruce protocol stress test was performed. Exercise capacity was excellent. Patient exercised for 9 min. Maximum HR of 151 bpm. MPHR 88.0%. Peak METS 10.1. The patient experienced no angina during the test. The test was stopped because the patient experienced fatigue and dyspnea. The patient reported dyspnea during the stress test. Normal blood pressure and normal heart rate response noted during stress. Heart rate recovery was normal.   The ECG was negative for ischemia.   LV perfusion is normal. There is no evidence of ischemia. There is no evidence of infarction.   Left ventricular function  is normal. Nuclear stress EF: 74%. The left ventricular ejection fraction is hyperdynamic (>65%). End diastolic cavity size is normal. End systolic cavity size is normal. No evidence of transient ischemic dilation (TID) noted.   CT images were obtained for attenuation correction and were examined for the presence of coronary calcium  when appropriate.   Coronary calcium  assessment not performed due to prior revascularization.   Prior study not available for comparison.    EKG/Medicine tests: {LSEKG:33414} EKG Interpretation:    Decision rules:  {Document cardiac monitor, telemetry assessment procedure when appropriate:1} {   Click here for ABCD2, HEART and other calculatorsREFRESH Note before  signing :1}   {Document critical care time when appropriate:1} {Document review of labs and clinical decision tools ie heart score, Chads2Vasc2 etc:1}  {Document your independent review of radiology images, and any outside records:1} {Document your discussion with family members, caretakers, and with consultants:1} {Document social determinants of health affecting pt's care:1} {Document your decision making why or why not admission, treatments were needed:1}                Interventions:***   See the EMR for full details regarding lab and imaging results.  ***  {LSCOPA:33420}  Discussion of management or test interpretations with external provider(s): ***  Risk Drugs:{LSDRUGS:33399} Treatment: {LSTREATMENT:33409} Surgery:{LSSURGERY:33410} Critical Care: ***  Disposition: {LSDISPO:33388}  MDM generated using voice dictation software and may contain dictation errors.  Please contact me for any clarification or with any questions.  Clinical Impression: No diagnosis found.   Data Unavailable   Final Clinical Impression(s) / ED Diagnoses Final diagnoses:  None    Rx / DC Orders ED Discharge Orders     None

## 2024-01-17 NOTE — Discharge Instructions (Signed)
 Wendy Tate  Thank you for allowing us  to take care of you today.  You came to the Emergency Department today because beginning last night you developed bloody diarrhea.  Here in the emergency department your exam, vitals, and labs are reassuring, you do not have a significant elevation of your white blood cell counts, your red blood cells are stable so you have not had a extreme amount of bleeding, and your blood cells look good.  Your CT does show inflammation of your colon, this is possibly due to an infectious or a inflammation condition, however given there is blood in your diarrhea we are worried about a possible bacterial infection, with so we are starting you on a course of antibiotics called Augmentin .  Please follow-up with your primary care doctor within about a week for recheck.  To-Do: 1. Please follow-up with your primary doctor within 1 week/ as soon as possible.   Please return to the Emergency Department or call 911 if you experience have worsening of your symptoms, or do not get better, worsening bleeding, chest pain, shortness of breath, severe or significantly worsening pain, high fever, severe confusion, pass out or have any reason to think that you need emergency medical care.   We hope you feel better soon.   Mitzie Later, MD Department of Emergency Medicine [facility]

## 2024-01-17 NOTE — ED Triage Notes (Signed)
 Pt presents to ED from home C/O abdominal pain, diarrhea, bright red blood in stool since last night.

## 2024-01-19 ENCOUNTER — Other Ambulatory Visit: Payer: Self-pay | Admitting: Obstetrics and Gynecology

## 2024-01-19 DIAGNOSIS — Z124 Encounter for screening for malignant neoplasm of cervix: Secondary | ICD-10-CM | POA: Diagnosis not present

## 2024-01-20 LAB — SURGICAL PATHOLOGY

## 2024-01-25 NOTE — Patient Instructions (Signed)
 It was good to see you again today, I am glad you are feeling better Recommend flu shot this fall

## 2024-01-25 NOTE — Progress Notes (Unsigned)
 [facility] at [facility] [address], [address] [place], [place] [address] [phone]  Date:  01/26/2024   Name:  Wendy Tate   DOB:  17-May-1976   MRN:  [id number]  PCP:  Wendy Harlene BROCKS, MD    Chief Complaint: No chief complaint on file.   History of Present Illness:  Wendy Tate is a 48 y.o. very pleasant female patient who presents with the following:  Patient seen today for follow-up from recent emergency room visit-our most recent previous visit was last November for her physical History of prediabetes, dysfunctional uterine bleeding, urinary incontinence, CAD   She presented to the [facility] health ER on September 1 with concern of bloody diarrhea The day prior she had eaten out with her wife and some friends, shortly after eating she started vomiting and then had abdominal cramping and diarrhea.  She had several episodes of diarrhea throughout the evening and started noticed some blood in her stool No one else apparently got sick after eating The ER got some labs and a CT White count normal, liver and kidney labs okay CT scan showed colitis, she was discharged home with Augmentin   She has typically used Cologuard for her colon cancer screening, negative in 2021 and 2024 Minimal hypokalemia, 3.4  Discussed the use of AI scribe software for clinical note transcription with the patient, who gave verbal consent to proceed.  History of Present Illness Wendy Tate is a 48 year old female who presents with recent gastrointestinal symptoms including bloody diarrhea.  She experienced an episode of bloody diarrhea that began recently and resolved by Thursday, January 27, 2024. Following this, she had diarrhea without blood on Thursday and returned to normal bowel movements by Friday. She had one episode of induced vomiting and no fever during this period. She recalls a similar episode of bloody diarrhea several years ago after  taking diclofenac for tendinitis, but she is unsure if it was related.  During the recent episode, she went to the ER due to the severity of the symptoms. Blood work showed slightly low potassium levels and no significant blood loss affecting hemoglobin levels. A CT scan showed inflammation of the colon (colitis) as reported to the patient. She completed a course of Augmentin , which she was taking during the episode. She denies any unusual dietary intake or exposure to potentially contaminated food that could have caused the symptoms.  She is currently taking metoprolol  every evening for palpitations and reports that her blood pressure tends to run low, though she does not feel lightheaded. She has also started taking Wellbutrin  to help reduce nicotine gum use and notes an improvement in her mood. She has a 90-day supply of Wellbutrin  with a refill available.  No fever and one episode of induced vomiting. She confirms seeing a significant amount of blood in the toilet bowl, which prompted her ER visit.    Patient Active Problem List   Diagnosis Date Noted   Unstable angina (HCC) 06/22/2022   Hyperlipidemia LDL goal <70 06/22/2022   Palpitations 06/16/2022   CAD in native artery 06/16/2022   Anaphylaxis 02/28/2020   Prediabetes 01/02/2020   Bilateral carpal tunnel syndrome 12/30/2016   Obesity 03/18/2016   ASCUS (atypical squamous cells of undetermined significance) on Pap smear 02/16/2012   Hx LEEP (loop electrosurgical excision procedure 02/16/2012    Past Medical History:  Diagnosis Date   Abnormal Pap smear 04/2007, 04/2009, 07/2011   ASCUS  CAD in native artery 06/16/2022   Chicken pox    Palpitations 06/16/2022   Seasonal allergies     Past Surgical History:  Procedure Laterality Date   ADENOIDECTOMY     APPENDECTOMY     CORONARY IMAGING/OCT N/A 06/22/2022   Procedure: INTRAVASCULAR IMAGING/OCT;  Surgeon: Anner Alm ORN, MD;  Location: [facility] INVASIVE CV LAB;  Service:  Cardiovascular;  Laterality: N/A;   CORONARY STENT INTERVENTION N/A 06/22/2022   Procedure: CORONARY STENT INTERVENTION;  Surgeon: Anner Alm ORN, MD;  Location: [facility] INVASIVE CV LAB;  Service: Cardiovascular;  Laterality: N/A;   DILATATION & CURETTAGE/HYSTEROSCOPY WITH TRUECLEAR N/A 01/18/2013   Procedure: DILATATION & CURETTAGE/HYSTEROSCOPY WITH TRUECLEAR;  Surgeon: Ovid DELENA All, MD;  Location: [facility] ORS;  Service: Gynecology;  Laterality: N/A;  D&C Hysteroscopy with TruClear   LEEP     LEFT HEART CATH AND CORONARY ANGIOGRAPHY N/A 06/22/2022   Procedure: LEFT HEART CATH AND CORONARY ANGIOGRAPHY;  Surgeon: Anner Alm ORN, MD;  Location: [facility] INVASIVE CV LAB;  Service: Cardiovascular;  Laterality: N/A;   TONSILLECTOMY     WISDOM TOOTH EXTRACTION      Social History   Tobacco Use   Smoking status: Former    Current packs/day: 0.00    Types: Cigarettes    Quit date: 08/04/2011    Years since quitting: 12.4   Smokeless tobacco: Never   Tobacco comments:    Socially  Vaping Use   Vaping status: Never Used  Substance Use Topics   Alcohol use: Yes    Alcohol/week: 1.0 standard drink of alcohol    Types: 1 Standard drinks or equivalent per week    Comment: socially   Drug use: No    Comment: hx.    Family History  Problem Relation Age of Onset   Heart attack Mother    Heart disease Mother    Diabetes Mother    Heart attack Father    Heart disease Father    Leukemia Father        died at age 84   Hodgkin's lymphoma Maternal Uncle    Heart disease Maternal Grandmother    Arthritis Maternal Grandmother    Stroke Maternal Grandmother    Diabetes Maternal Grandmother     Allergies  Allergen Reactions   Iodinated Contrast Media Anaphylaxis, Hives, Swelling and Other (See Comments)    Medication list has been reviewed and updated.  Current Outpatient Medications on File Prior to Visit  Medication Sig Dispense Refill   acetaminophen  (TYLENOL ) 500 MG tablet Take 500-1,000 mg by  mouth every 6 (six) hours as needed for mild pain (pain score 1-3) or moderate pain (pain score 4-6).     amoxicillin -clavulanate (AUGMENTIN ) 875-125 MG tablet Take 1 tablet by mouth every 12 (twelve) hours. 14 tablet 0   Ascorbic Acid (VITAMIN C PO) Take 1 tablet by mouth daily.     aspirin  EC 81 MG tablet Take 1 tablet (81 mg total) by mouth daily. Swallow whole.     buPROPion  (WELLBUTRIN  SR) 150 MG 12 hr tablet Take 1 tablet (150 mg total) by mouth 2 (two) times daily. 180 tablet 1   cholecalciferol (VITAMIN D3) 25 MCG (1000 UNIT) tablet Take 1,000 Units by mouth daily.     Coenzyme Q10 (COQ10) 200 MG CAPS Take 200 mg by mouth daily.     cyanocobalamin (VITAMIN B12) 1000 MCG tablet Take 1,000 mcg by mouth daily.     cyclobenzaprine  (FLEXERIL ) 10 MG tablet Take 1 tablet (10 mg  total) by mouth at bedtime. Use as needed for muscle pain. Use this or methocarbamol - not both (Patient taking differently: Take 10 mg by mouth at bedtime as needed for muscle spasms (or pain- when NOT taking Methocarbamol /not both).) 30 tablet 0   methocarbamol  (ROBAXIN ) 500 MG tablet Take 1 tablet (500 mg total) by mouth 2 (two) times daily. Use as needed for muscle spasm  use this or cyclobenzaprine - not both (Patient taking differently: Take 500 mg by mouth 2 (two) times daily as needed for muscle spasms (when NOT taking Cyclobenzaprine /not both).) 30 tablet 0   metoprolol  succinate (TOPROL -XL) 25 MG 24 hr tablet Take 1 tablet (25 mg total) by mouth at bedtime. May take additional tablet as needed for breakthrough palpitations. (Patient taking differently: Take 25 mg by mouth See admin instructions. Take 25 mg by mouth at bedtime and an additional 25 mg as needed for breakthrough palpitations) 100 tablet 3   Multiple Vitamins-Minerals (HAIR/SKIN/NAILS) TABS Take 1 tablet by mouth daily.     nicotine polacrilex (NICORETTE) 2 MG gum Take 2 mg by mouth as needed for smoking cessation (**CHEW**).     Omega-3 Fatty Acids (FISH  OIL) 1000 MG CAPS Take 2,000 mg by mouth daily.     Rhubarb (ESTROVEN MENOPAUSE RELIEF) 4 MG TABS Take 4 mg by mouth daily.     rosuvastatin  (CRESTOR ) 20 MG tablet Take 1 tablet (20 mg total) by mouth daily. 90 tablet 3   No current facility-administered medications on file prior to visit.    Review of Systems:  As per HPI- otherwise negative.   Physical Examination: Vitals:   01/26/24 1543  BP: 105/71  Pulse: 76   Vitals:   01/26/24 1543  Weight: 193 lb 6.4 oz (87.7 kg)  Height: 5' 4 (1.626 m)   Body mass index is 33.2 kg/m. Ideal Body Weight: Weight in (lb) to have BMI = 25: 145.3  GEN: no acute distress.  Obese, looks well HEENT: Atraumatic, Normocephalic.  Ears and Nose: No external deformity. CV: RRR, No M/G/R. No JVD. No thrill. No extra heart sounds. PULM: CTA B, no wheezes, crackles, rhonchi. No retractions. No resp. distress. No accessory muscle use. ABD: S, NT, ND, +BS. No rebound. No HSM. EXTR: No c/c/e PSYCH: Normally interactive. Conversant.   BP Readings from Last 3 Encounters:  01/26/24 105/71  01/17/24 120/67  12/21/23 99/67    Assessment and Plan: Infectious colitis  Palpitations  Depression, major, single episode, mild (HCC)  Assessment & Plan Infectious gastroenteritis and colitis Resolved symptoms post-Augmentin . CT indicated likely infectious or inflammatory colitis, possibly foodborne. - Monitor for symptom recurrence. - Consider colonoscopy if further episodes occur to rule out Crohn's disease.  Palpitations Continues metoprolol . Blood pressure low, likely due to metoprolol , but no lightheadedness.  At this time she is happy to continue her metoprolol   Nicotine dependence, chewing gum Started Wellbutrin  for cessation. Decreased nicotine gum use, improved mood with Wellbutrin . - Continue Wellbutrin  as prescribed. - Refill Wellbutrin  as needed.  Depression Improved mood with Wellbutrin , initially started for nicotine dependence. -  Continue Wellbutrin  as prescribed.  Signed Harlene Schroeder, MD

## 2024-01-26 ENCOUNTER — Encounter: Payer: Self-pay | Admitting: Family Medicine

## 2024-01-26 ENCOUNTER — Ambulatory Visit (INDEPENDENT_AMBULATORY_CARE_PROVIDER_SITE_OTHER): Admitting: Family Medicine

## 2024-01-26 VITALS — BP 105/71 | HR 76 | Ht 64.0 in | Wt 193.4 lb

## 2024-01-26 DIAGNOSIS — R002 Palpitations: Secondary | ICD-10-CM

## 2024-01-26 DIAGNOSIS — F320 Major depressive disorder, single episode, mild: Secondary | ICD-10-CM

## 2024-01-26 DIAGNOSIS — A09 Infectious gastroenteritis and colitis, unspecified: Secondary | ICD-10-CM | POA: Diagnosis not present

## 2024-01-31 ENCOUNTER — Ambulatory Visit: Admitting: Family Medicine

## 2024-04-08 NOTE — Patient Instructions (Incomplete)
 It was good to see you again today I will be in touch with your labs and will reach out to your cardiology team about your adding OCP for sx of perimenopause.  I will let you know what they have to say!    Mammo due next month

## 2024-04-08 NOTE — Progress Notes (Unsigned)
 [facility] at [facility] [address], [address] [place], [place] [address] [phone]  Date:  04/10/2024   Name:  Wendy Tate   DOB:  February 25, 1976   MRN:  [id number]  PCP:  Watt Harlene BROCKS, MD    Chief Complaint: No chief complaint on file.   History of Present Illness:  Wendy Tate is a 48 y.o. very pleasant female patient who presents with the following:  Patient seen today for physical exam.  I saw her most recently in September when she was dealing with some infectious colitis History of prediabetes, dysfunctional uterine bleeding, urinary incontinence, CAD  She does have history of heart disease, she had a cath and subsequent PCI in 2024 She did a Myoview  this past August which looked good Most recent cardiology visit in August: CAD 06/2022 DES-LAD / HLD, LDL goal <70 - Due to persistent exertional dyspnea, plan for lexiscan myoview  to rule out ischemia.SABRA  GDMT aspirin , toprol , rosuvastatin . Heart healthy diet and regular cardiovascular exercise encouraged.    BMI 32 / Prediabetes - Weight loss via diet and exercise encouraged. Discussed the impact being overweight would have on cardiovascular risk.SABRA Unfortunately, insurance does not cover weight loss medications such as GLP1. Has good aerobic workout with walking. Has also started some exercise videos on Youtube. Palpitations - prior monitor with no significant arrhythmias. No recurrent palpitations. Continue Toprol  25mg  QD. May take additional 12.5mg  for breakthrough palpitations.  She has GYN care with Dr. Armond Flu vaccine Pap smear 2024 Cologuard completed 12/24, negative  Wellbutrin  300 mg total daily Crestor  Metoprolol  Aspirin   Discussed the use of AI scribe software for clinical note transcription with the patient, who gave verbal consent to proceed.  History of Present Illness     Patient Active Problem List   Diagnosis Date Noted   Unstable angina  (HCC) 06/22/2022   Hyperlipidemia LDL goal <70 06/22/2022   Palpitations 06/16/2022   CAD in native artery 06/16/2022   Anaphylaxis 02/28/2020   Prediabetes 01/02/2020   Bilateral carpal tunnel syndrome 12/30/2016   Obesity 03/18/2016   ASCUS (atypical squamous cells of undetermined significance) on Pap smear 02/16/2012   Hx LEEP (loop electrosurgical excision procedure 02/16/2012    Past Medical History:  Diagnosis Date   Abnormal Pap smear 04/2007, 04/2009, 07/2011   ASCUS    CAD in native artery 06/16/2022   Chicken pox    Palpitations 06/16/2022   Seasonal allergies     Past Surgical History:  Procedure Laterality Date   ADENOIDECTOMY     APPENDECTOMY     CORONARY IMAGING/OCT N/A 06/22/2022   Procedure: INTRAVASCULAR IMAGING/OCT;  Surgeon: Anner Alm ORN, MD;  Location: [facility] INVASIVE CV LAB;  Service: Cardiovascular;  Laterality: N/A;   CORONARY STENT INTERVENTION N/A 06/22/2022   Procedure: CORONARY STENT INTERVENTION;  Surgeon: Anner Alm ORN, MD;  Location: [facility] INVASIVE CV LAB;  Service: Cardiovascular;  Laterality: N/A;   DILATATION & CURETTAGE/HYSTEROSCOPY WITH TRUECLEAR N/A 01/18/2013   Procedure: DILATATION & CURETTAGE/HYSTEROSCOPY WITH TRUECLEAR;  Surgeon: Ovid DELENA Armond, MD;  Location: [facility] ORS;  Service: Gynecology;  Laterality: N/A;  D&C Hysteroscopy with TruClear   LEEP     LEFT HEART CATH AND CORONARY ANGIOGRAPHY N/A 06/22/2022   Procedure: LEFT HEART CATH AND CORONARY ANGIOGRAPHY;  Surgeon: Anner Alm ORN, MD;  Location: [facility] INVASIVE CV LAB;  Service: Cardiovascular;  Laterality: N/A;   TONSILLECTOMY     WISDOM TOOTH EXTRACTION  Social History   Tobacco Use   Smoking status: Former    Current packs/day: 0.00    Types: Cigarettes    Quit date: 08/04/2011    Years since quitting: 12.6   Smokeless tobacco: Never   Tobacco comments:    Socially  Vaping Use   Vaping status: Never Used  Substance Use Topics   Alcohol use: Yes    Alcohol/week: 1.0 standard  drink of alcohol    Types: 1 Standard drinks or equivalent per week    Comment: socially   Drug use: No    Comment: hx.    Family History  Problem Relation Age of Onset   Heart attack Mother    Heart disease Mother    Diabetes Mother    Heart attack Father    Heart disease Father    Leukemia Father        died at age 24   Hodgkin's lymphoma Maternal Uncle    Heart disease Maternal Grandmother    Arthritis Maternal Grandmother    Stroke Maternal Grandmother    Diabetes Maternal Grandmother     Allergies  Allergen Reactions   Iodinated Contrast Media Anaphylaxis, Hives, Swelling and Other (See Comments)    Medication list has been reviewed and updated.  Current Outpatient Medications on File Prior to Visit  Medication Sig Dispense Refill   acetaminophen  (TYLENOL ) 500 MG tablet Take 500-1,000 mg by mouth every 6 (six) hours as needed for mild pain (pain score 1-3) or moderate pain (pain score 4-6).     amoxicillin -clavulanate (AUGMENTIN ) 875-125 MG tablet Take 1 tablet by mouth every 12 (twelve) hours. 14 tablet 0   Ascorbic Acid (VITAMIN C PO) Take 1 tablet by mouth daily.     aspirin  EC 81 MG tablet Take 1 tablet (81 mg total) by mouth daily. Swallow whole.     buPROPion  (WELLBUTRIN  SR) 150 MG 12 hr tablet Take 1 tablet (150 mg total) by mouth 2 (two) times daily. 180 tablet 1   cholecalciferol (VITAMIN D3) 25 MCG (1000 UNIT) tablet Take 1,000 Units by mouth daily.     Coenzyme Q10 (COQ10) 200 MG CAPS Take 200 mg by mouth daily.     cyanocobalamin (VITAMIN B12) 1000 MCG tablet Take 1,000 mcg by mouth daily.     cyclobenzaprine  (FLEXERIL ) 10 MG tablet Take 1 tablet (10 mg total) by mouth at bedtime. Use as needed for muscle pain. Use this or methocarbamol - not both (Patient taking differently: Take 10 mg by mouth at bedtime as needed for muscle spasms (or pain- when NOT taking Methocarbamol /not both).) 30 tablet 0   methocarbamol  (ROBAXIN ) 500 MG tablet Take 1 tablet (500 mg  total) by mouth 2 (two) times daily. Use as needed for muscle spasm  use this or cyclobenzaprine - not both (Patient taking differently: Take 500 mg by mouth 2 (two) times daily as needed for muscle spasms (when NOT taking Cyclobenzaprine /not both).) 30 tablet 0   metoprolol  succinate (TOPROL -XL) 25 MG 24 hr tablet Take 1 tablet (25 mg total) by mouth at bedtime. May take additional tablet as needed for breakthrough palpitations. (Patient taking differently: Take 25 mg by mouth See admin instructions. Take 25 mg by mouth at bedtime and an additional 25 mg as needed for breakthrough palpitations) 100 tablet 3   Multiple Vitamins-Minerals (HAIR/SKIN/NAILS) TABS Take 1 tablet by mouth daily.     nicotine polacrilex (NICORETTE) 2 MG gum Take 2 mg by mouth as needed for smoking cessation (**CHEW**).  Omega-3 Fatty Acids (FISH OIL) 1000 MG CAPS Take 2,000 mg by mouth daily.     Rhubarb (ESTROVEN MENOPAUSE RELIEF) 4 MG TABS Take 4 mg by mouth daily.     rosuvastatin  (CRESTOR ) 20 MG tablet Take 1 tablet (20 mg total) by mouth daily. 90 tablet 3   No current facility-administered medications on file prior to visit.    Review of Systems:  As per HPI- otherwise negative.   Physical Examination: There were no vitals filed for this visit. There were no vitals filed for this visit. There is no height or weight on file to calculate BMI. Ideal Body Weight:    GEN: no acute distress. HEENT: Atraumatic, Normocephalic.  Ears and Nose: No external deformity. CV: RRR, No M/G/R. No JVD. No thrill. No extra heart sounds. PULM: CTA B, no wheezes, crackles, rhonchi. No retractions. No resp. distress. No accessory muscle use. ABD: S, NT, ND, +BS. No rebound. No HSM. EXTR: No c/c/e PSYCH: Normally interactive. Conversant.    Assessment and Plan: No diagnosis found.  Assessment & Plan   Signed Harlene Schroeder, MD

## 2024-04-10 ENCOUNTER — Encounter: Payer: Self-pay | Admitting: Family Medicine

## 2024-04-10 ENCOUNTER — Ambulatory Visit (INDEPENDENT_AMBULATORY_CARE_PROVIDER_SITE_OTHER): Admitting: Family Medicine

## 2024-04-10 VITALS — BP 114/76 | HR 81 | Temp 98.7°F | Ht 64.0 in | Wt 205.0 lb

## 2024-04-10 DIAGNOSIS — Z1329 Encounter for screening for other suspected endocrine disorder: Secondary | ICD-10-CM

## 2024-04-10 DIAGNOSIS — E785 Hyperlipidemia, unspecified: Secondary | ICD-10-CM

## 2024-04-10 DIAGNOSIS — R002 Palpitations: Secondary | ICD-10-CM

## 2024-04-10 DIAGNOSIS — Z720 Tobacco use: Secondary | ICD-10-CM

## 2024-04-10 DIAGNOSIS — R7303 Prediabetes: Secondary | ICD-10-CM

## 2024-04-10 DIAGNOSIS — I25118 Atherosclerotic heart disease of native coronary artery with other forms of angina pectoris: Secondary | ICD-10-CM

## 2024-04-10 DIAGNOSIS — R931 Abnormal findings on diagnostic imaging of heart and coronary circulation: Secondary | ICD-10-CM | POA: Diagnosis not present

## 2024-04-10 DIAGNOSIS — Z0000 Encounter for general adult medical examination without abnormal findings: Secondary | ICD-10-CM | POA: Diagnosis not present

## 2024-04-10 DIAGNOSIS — M62838 Other muscle spasm: Secondary | ICD-10-CM

## 2024-04-10 LAB — LIPID PANEL
Cholesterol: 121 mg/dL (ref 0–200)
HDL: 42.4 mg/dL (ref >39.00)
LDL Cholesterol: 57 mg/dL (ref 0–99)
NonHDL: 78.16
Total CHOL/HDL Ratio: 3
Triglycerides: 108 mg/dL (ref 0.0–149.0)
VLDL: 21.6 mg/dL (ref 0.0–40.0)

## 2024-04-10 LAB — HEMOGLOBIN A1C: Hgb A1c MFr Bld: 5.4 % (ref 4.6–6.5)

## 2024-04-10 LAB — BASIC METABOLIC PANEL WITH GFR
BUN: 11 mg/dL (ref 6–23)
CO2: 26 meq/L (ref 19–32)
Calcium: 9.5 mg/dL (ref 8.4–10.5)
Chloride: 105 meq/L (ref 96–112)
Creatinine, Ser: 0.89 mg/dL (ref 0.40–1.20)
GFR: 76 mL/min (ref >60.00)
Glucose, Bld: 88 mg/dL (ref 70–99)
Potassium: 4.4 meq/L (ref 3.5–5.1)
Sodium: 138 meq/L (ref 135–145)

## 2024-04-10 LAB — TSH: TSH: 3.42 u[IU]/mL (ref 0.35–5.50)

## 2024-04-10 MED ORDER — METOPROLOL SUCCINATE ER 25 MG PO TB24: 25.0000 mg | ORAL_TABLET | Freq: Every day | ORAL | 3 refills | Status: AC

## 2024-04-10 MED ORDER — BUPROPION HCL ER (SR) 150 MG PO TB12: 150.0000 mg | ORAL_TABLET | Freq: Two times a day (BID) | ORAL | 1 refills | Status: AC

## 2024-04-10 MED ORDER — CYCLOBENZAPRINE HCL 10 MG PO TABS
10.0000 mg | ORAL_TABLET | Freq: Every day | ORAL | 0 refills | Status: AC
Start: 2024-04-10 — End: ?

## 2024-04-10 MED ORDER — ROSUVASTATIN CALCIUM 20 MG PO TABS: 20.0000 mg | ORAL_TABLET | Freq: Every day | ORAL | 3 refills | Status: AC

## 2024-04-10 MED ORDER — METHOCARBAMOL 500 MG PO TABS: 500.0000 mg | ORAL_TABLET | Freq: Two times a day (BID) | ORAL | 0 refills | Status: AC

## 2024-04-17 ENCOUNTER — Other Ambulatory Visit: Payer: Self-pay | Admitting: Family Medicine

## 2024-04-17 DIAGNOSIS — Z1231 Encounter for screening mammogram for malignant neoplasm of breast: Secondary | ICD-10-CM

## 2024-04-17 NOTE — Telephone Encounter (Signed)
 Hi,  Thanks for the update and looping us  in. Discussed with Dr. Raford in clinic.   Generally still recommend avoidance if history of CAD particularly with intervention given her prior DES. As her stent was not thromboembolic (not an MI) it could be considered with discussion of risk/benefit with Wendy Tate. The [employer] hasn't come out with a formal recommendation since the most recent removal of FDA black box warning. Similar thought with birth control as both can increase risk of thromboembolic event and MI.   Generally, transdermal would be preferred over oral agents and for as short a duration as possible.   Madyn Ivins S Yassin Scales, NP

## 2024-04-28 MED ORDER — PROGESTERONE MICRONIZED 100 MG PO CAPS: 100.0000 mg | ORAL_CAPSULE | Freq: Every day | ORAL | 5 refills | Status: AC

## 2024-04-28 MED ORDER — ESTRADIOL 0.025 MG/24HR TD PTWK: 0.0250 mg | MEDICATED_PATCH | TRANSDERMAL | 12 refills | Status: AC

## 2024-04-28 NOTE — Addendum Note (Signed)
 Addended by: WATT RAISIN C on: 04/28/2024 05:11 PM   Modules accepted: Orders

## 2024-05-16 ENCOUNTER — Ambulatory Visit
Admission: RE | Admit: 2024-05-16 | Discharge: 2024-05-16 | Disposition: A | Discharge status: Home / Self Care | Source: Ambulatory Visit

## 2024-05-16 DIAGNOSIS — Z1231 Encounter for screening mammogram for malignant neoplasm of breast: Secondary | ICD-10-CM

## 2024-06-27 ENCOUNTER — Ambulatory Visit (HOSPITAL_BASED_OUTPATIENT_CLINIC_OR_DEPARTMENT_OTHER): Admitting: Family

## 2024-10-06 ENCOUNTER — Ambulatory Visit (HOSPITAL_BASED_OUTPATIENT_CLINIC_OR_DEPARTMENT_OTHER): Admitting: Family
